# Patient Record
Sex: Female | Born: 1985
Health system: Southern US, Community
[De-identification: ages and names within clinical notes are randomized; demographics above are authoritative.]

## PROBLEM LIST (undated history)

## (undated) DIAGNOSIS — K219 Gastro-esophageal reflux disease without esophagitis: Secondary | ICD-10-CM

## (undated) DIAGNOSIS — F419 Anxiety disorder, unspecified: Secondary | ICD-10-CM

## (undated) DIAGNOSIS — F99 Mental disorder, not otherwise specified: Secondary | ICD-10-CM

## (undated) DIAGNOSIS — R519 Headache, unspecified: Secondary | ICD-10-CM

## (undated) HISTORY — DX: Headache, unspecified: R51.9

## (undated) HISTORY — DX: Anxiety disorder, unspecified: F41.9

## (undated) HISTORY — PX: TONSILLECTOMY: SUR1361

---
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
## 2009-06-16 ENCOUNTER — Emergency Department (HOSPITAL_COMMUNITY): Admission: EM | Admit: 2009-06-16 | Discharge: 2009-06-16 | Payer: Self-pay | Admitting: Emergency Medicine

## 2009-06-17 ENCOUNTER — Inpatient Hospital Stay (HOSPITAL_COMMUNITY): Admission: AD | Admit: 2009-06-17 | Discharge: 2009-06-19 | Payer: Self-pay | Admitting: Obstetrics & Gynecology

## 2009-06-17 ENCOUNTER — Ambulatory Visit: Payer: Self-pay | Admitting: Obstetrics & Gynecology

## 2011-02-25 LAB — WET PREP, GENITAL: Trich, Wet Prep: NONE SEEN

## 2011-02-25 LAB — DIFFERENTIAL
Eosinophils Absolute: 0.1 10*3/uL (ref 0.0–0.7)
Eosinophils Relative: 1 % (ref 0–5)
Lymphs Abs: 1.4 10*3/uL (ref 0.7–4.0)
Monocytes Absolute: 0.6 10*3/uL (ref 0.1–1.0)

## 2011-02-25 LAB — CBC
HCT: 31.8 % — ABNORMAL LOW (ref 36.0–46.0)
MCHC: 34 g/dL (ref 30.0–36.0)
MCV: 94.2 fL (ref 78.0–100.0)
MCV: 94.3 fL (ref 78.0–100.0)
Platelets: 225 10*3/uL (ref 150–400)
Platelets: 249 10*3/uL (ref 150–400)
RDW: 13.1 % (ref 11.5–15.5)
WBC: 13.5 10*3/uL — ABNORMAL HIGH (ref 4.0–10.5)
WBC: 15.7 10*3/uL — ABNORMAL HIGH (ref 4.0–10.5)

## 2011-02-25 LAB — URINE CULTURE

## 2011-02-25 LAB — URINALYSIS, ROUTINE W REFLEX MICROSCOPIC
Nitrite: NEGATIVE
Specific Gravity, Urine: 1.01 (ref 1.005–1.030)
Urobilinogen, UA: 0.2 mg/dL (ref 0.0–1.0)

## 2011-02-25 LAB — URINE MICROSCOPIC-ADD ON

## 2011-02-25 LAB — RAPID URINE DRUG SCREEN, HOSP PERFORMED
Barbiturates: NOT DETECTED
Benzodiazepines: NOT DETECTED
Cocaine: NOT DETECTED
Opiates: POSITIVE — AB

## 2011-02-25 LAB — ABO/RH: ABO/RH(D): O POS

## 2011-02-25 LAB — PREGNANCY, URINE: Preg Test, Ur: POSITIVE

## 2011-02-25 LAB — GC/CHLAMYDIA PROBE AMP, GENITAL: Chlamydia, DNA Probe: NEGATIVE

## 2011-02-25 LAB — HEPATITIS B SURFACE ANTIGEN: Hepatitis B Surface Ag: NEGATIVE

## 2012-07-30 ENCOUNTER — Other Ambulatory Visit (HOSPITAL_COMMUNITY)
Admission: RE | Admit: 2012-07-30 | Discharge: 2012-07-30 | Disposition: A | Discharge status: Home / Self Care | Payer: 59 | Source: Ambulatory Visit | Attending: Obstetrics and Gynecology | Admitting: Obstetrics and Gynecology

## 2012-07-30 DIAGNOSIS — Z01419 Encounter for gynecological examination (general) (routine) without abnormal findings: Secondary | ICD-10-CM | POA: Insufficient documentation

## 2013-08-12 ENCOUNTER — Encounter: Payer: Self-pay | Admitting: Obstetrics and Gynecology

## 2013-08-12 ENCOUNTER — Ambulatory Visit (INDEPENDENT_AMBULATORY_CARE_PROVIDER_SITE_OTHER): Payer: 59 | Admitting: Obstetrics and Gynecology

## 2013-08-12 VITALS — BP 120/80 | Ht 64.0 in | Wt 189.0 lb

## 2013-08-12 DIAGNOSIS — Z01419 Encounter for gynecological examination (general) (routine) without abnormal findings: Secondary | ICD-10-CM

## 2013-08-12 DIAGNOSIS — Z309 Encounter for contraceptive management, unspecified: Secondary | ICD-10-CM

## 2013-08-12 MED ORDER — ETONOGESTREL-ETHINYL ESTRADIOL 0.12-0.015 MG/24HR VA RING: VAGINAL_RING | VAGINAL | Status: DC

## 2013-08-12 NOTE — Progress Notes (Signed)
Patient ID: Cynthia Parrish, female   DOB: 10-Oct-1986, 27 y.o.   MRN: [id number] Pt here today for annual pap and physical  Assessment:  Annual Gyn Exam   Plan:  1. pap smear done last yr, next pap due 2016 2. return annually or prn 3    Annual mammogram advised Subjective:  Cynthia Parrish is a 27 y.o. female No obstetric history on file. who presents for annual exam. Patient's last menstrual period was 08/09/2013. The patient has complaints today of none  The following portions of the patient's history were reviewed and updated as appropriate: allergies, current medications, past family history, past medical history, past social history, past surgical history and problem list.  Review of Systems Constitutional: negative Gastrointestinal: negative Genitourinary:   Objective:  BP 120/80  Ht 5\' 4"  (1.626 m)  Wt 189 lb (85.73 kg)  BMI 32.43 kg/m2  LMP 08/09/2013   BMI: Body mass index is 32.43 kg/(m^2).  General Appearance: Alert, appropriate appearance for age. No acute distress HEENT: Grossly normal Neck / Thyroid:  Cardiovascular: RRR; normal S1, S2, no murmur Lungs: CTA bilaterally Back: No CVAT Breast Exam: No dimpling, nipple retraction or discharge. No masses or nodes. and No masses or nodes.No dimpling, nipple retraction or discharge. Gastrointestinal: Soft, non-tender, no masses or organomegaly Pelvic Exam: Vulva and vagina appear normal. Bimanual exam reveals normal uterus and adnexa. Rectovaginal: not indicated Lymphatic Exam: Non-palpable nodes in neck, clavicular, axillary, or inguinal regions Skin: no rash or abnormalities Neurologic: Normal gait and speech, no tremor  Psychiatric: Alert and oriented, appropriate affect.  Urinalysis:Not done  Christin Bach. MD Pgr [phone] 3:28 PM

## 2013-08-12 NOTE — Patient Instructions (Addendum)
Will need pap in 2016 rings can be renewed x 1 year.

## 2013-09-11 ENCOUNTER — Other Ambulatory Visit (HOSPITAL_COMMUNITY): Payer: 59

## 2013-09-18 ENCOUNTER — Other Ambulatory Visit (HOSPITAL_COMMUNITY): Payer: 59

## 2014-04-01 ENCOUNTER — Ambulatory Visit: Payer: 59 | Admitting: Orthopedic Surgery

## 2014-04-14 ENCOUNTER — Other Ambulatory Visit: Payer: Self-pay | Admitting: Orthopedic Surgery

## 2014-04-14 ENCOUNTER — Ambulatory Visit (HOSPITAL_COMMUNITY)
Admission: RE | Admit: 2014-04-14 | Discharge: 2014-04-14 | Disposition: A | Discharge status: Home / Self Care | Payer: 59 | Source: Ambulatory Visit | Attending: Orthopedic Surgery | Admitting: Orthopedic Surgery

## 2014-04-14 DIAGNOSIS — M25561 Pain in right knee: Secondary | ICD-10-CM

## 2014-04-14 DIAGNOSIS — M25569 Pain in unspecified knee: Secondary | ICD-10-CM | POA: Insufficient documentation

## 2014-04-14 DIAGNOSIS — M25562 Pain in left knee: Secondary | ICD-10-CM

## 2014-04-15 ENCOUNTER — Encounter: Payer: Self-pay | Admitting: Orthopedic Surgery

## 2014-04-15 ENCOUNTER — Ambulatory Visit (INDEPENDENT_AMBULATORY_CARE_PROVIDER_SITE_OTHER): Payer: 59 | Admitting: Orthopedic Surgery

## 2014-04-15 VITALS — BP 124/74 | Ht 64.0 in | Wt 195.0 lb

## 2014-04-15 DIAGNOSIS — M2241 Chondromalacia patellae, right knee: Secondary | ICD-10-CM

## 2014-04-15 DIAGNOSIS — M2242 Chondromalacia patellae, left knee: Principal | ICD-10-CM

## 2014-04-15 DIAGNOSIS — M2240 Chondromalacia patellae, unspecified knee: Secondary | ICD-10-CM

## 2014-04-15 MED ORDER — NABUMETONE 500 MG PO TABS: 500.0000 mg | ORAL_TABLET | Freq: Two times a day (BID) | ORAL | Status: DC

## 2014-04-15 NOTE — Patient Instructions (Signed)
Chondromalacia Your exam shows your knee pain is likely due to a cartilage swelling and irritation under the knee cap called chondromalacia. The knee cap moves up and down in its groove when you walk, run, or squat. It can become irritated from sports or work activities if the knee cap is not lined up perfectly or your quadriceps muscle is relatively weak. This can cause pain, usually around the knee cap but sometimes the back of the knee. It is most common in young and active people. Climbing stairs, prolonged sitting and rising from a chair will often make the pain worse. Treatment includes rest from activities which make it worse. The pain can be reduced with ice packs and anti-inflammatory pain medicine. Exercises to strengthen the thigh (quadriceps) muscle may help prevent further episodes of this condition. Shoe inserts to correct imbalances in the legs or feet may be prescribed by your doctor or a specialist. Support for the knee cap with a light brace may also be helpful. Call your caregiver if you are not improving after 2 - 3 weeks of treatment.  SEEK MEDICAL CARE IF:  You have increasing pain or your knee becomes hot, swollen, red, or begins to give out or lock up on you. Document Released: 12/13/2004 Document Revised: 01/28/2012 Document Reviewed: 05/03/2009 [facility] Patient Information 2014 [place].

## 2014-04-15 NOTE — Progress Notes (Signed)
Patient ID: Cynthia Parrish, female   DOB: 02/25/1986, 28 y.o.   MRN: [id number]  Chief Complaint  Patient presents with  . Knee Pain    Bilateral knee pain. No recent injury.    HISTORY: Today we have a 28 year old female with a history of bilateral knee pain right worse than left. She's had knee problems in the past at aspiration injection of cortisone x2 on the right knee now complains of recurrent pain for the last 6 months with associated symptoms of locking catching and anterior knee pain with stair climbing. Pain is worse at night and after activity such as standing pain is increased with climbing the steps. The pain is dull aching and burning 6/10 she's taken some Tylenol and has already had x-rays  No allergies  She is a smoker. Has a family history of asthma ulcers hypertension cancer and arthritis. Both parents are alive and both siblings are alive. Past medical history is negative she has had a tonsillectomy she is on no chronic medications  Review of systems is notable for sinusitis and sore throat swelling of the legs heartburn joint pain as described. Otherwise all systems were normal  Vital signs: BP 124/74  Ht 5\' 4"  (1.626 m)  Wt 195 lb (88.451 kg)  BMI 33.46 kg/m2  LMP 04/14/2014   General the patient is well-developed and well-nourished grooming and hygiene are normal Oriented x3 Mood and affect normal Ambulation normal ambulation without limp Inspection of the right knee patellofemoral pain with compression and traction quadriceps resistance test tenderness in the patellar facets Full range of motion All joints are stable Motor exam is normal Skin clean dry and intact  Mild crepitation in the left knee no pain or tenderness joint range of motion is normal and the joint is stable with normal muscular function and muscle tone  Cardiovascular exam is normal Sensory exam normal   Bilateral knee x-rays show a slightly dominant lateral facet bilaterally no  arthritis  Encounter Diagnosis  Name Primary?  . Chondromalacia of both patellae Yes    Orders Placed This Encounter  Procedures  . Ambulatory referral to Physical Therapy   Meds ordered this encounter  Medications  . nabumetone (RELAFEN) 500 MG tablet    Sig: Take 1 tablet (500 mg total) by mouth 2 (two) times daily.    Dispense:  60 tablet    Refill:  5

## 2014-07-07 ENCOUNTER — Other Ambulatory Visit (HOSPITAL_COMMUNITY): Payer: Self-pay | Admitting: Family Medicine

## 2014-07-07 DIAGNOSIS — R1013 Epigastric pain: Secondary | ICD-10-CM

## 2014-07-09 ENCOUNTER — Ambulatory Visit (HOSPITAL_COMMUNITY): Payer: 59

## 2014-07-12 ENCOUNTER — Ambulatory Visit (HOSPITAL_COMMUNITY)
Admission: RE | Admit: 2014-07-12 | Discharge: 2014-07-12 | Disposition: A | Discharge status: Home / Self Care | Payer: 59 | Source: Ambulatory Visit | Attending: Family Medicine | Admitting: Family Medicine

## 2014-07-12 DIAGNOSIS — R1013 Epigastric pain: Secondary | ICD-10-CM | POA: Insufficient documentation

## 2014-07-19 ENCOUNTER — Other Ambulatory Visit (HOSPITAL_COMMUNITY): Payer: Self-pay | Admitting: Family Medicine

## 2014-07-19 DIAGNOSIS — R1011 Right upper quadrant pain: Secondary | ICD-10-CM

## 2014-07-21 ENCOUNTER — Encounter (HOSPITAL_COMMUNITY): Payer: 59

## 2014-08-09 ENCOUNTER — Other Ambulatory Visit: Payer: Self-pay | Admitting: *Deleted

## 2014-08-13 ENCOUNTER — Telehealth: Payer: Self-pay | Admitting: Obstetrics and Gynecology

## 2014-08-13 MED ORDER — ETONOGESTREL-ETHINYL ESTRADIOL 0.12-0.015 MG/24HR VA RING: VAGINAL_RING | VAGINAL | Status: DC

## 2014-08-13 NOTE — Telephone Encounter (Signed)
Pt aware medication was sent to the pharmacy.

## 2014-08-13 NOTE — Telephone Encounter (Signed)
refil x 1 yr Nuvaring

## 2015-02-07 ENCOUNTER — Emergency Department (HOSPITAL_COMMUNITY)
Admission: EM | Admit: 2015-02-07 | Discharge: 2015-02-07 | Disposition: A | Discharge status: Home / Self Care | Payer: 59 | Attending: Emergency Medicine | Admitting: Emergency Medicine

## 2015-02-07 ENCOUNTER — Encounter (HOSPITAL_COMMUNITY): Payer: Self-pay

## 2015-02-07 DIAGNOSIS — Y9389 Activity, other specified: Secondary | ICD-10-CM | POA: Insufficient documentation

## 2015-02-07 DIAGNOSIS — S0083XA Contusion of other part of head, initial encounter: Secondary | ICD-10-CM | POA: Diagnosis not present

## 2015-02-07 DIAGNOSIS — Z720 Tobacco use: Secondary | ICD-10-CM | POA: Insufficient documentation

## 2015-02-07 DIAGNOSIS — S0990XA Unspecified injury of head, initial encounter: Secondary | ICD-10-CM | POA: Diagnosis present

## 2015-02-07 DIAGNOSIS — Y998 Other external cause status: Secondary | ICD-10-CM | POA: Diagnosis not present

## 2015-02-07 DIAGNOSIS — S161XXA Strain of muscle, fascia and tendon at neck level, initial encounter: Secondary | ICD-10-CM | POA: Diagnosis not present

## 2015-02-07 DIAGNOSIS — Z791 Long term (current) use of non-steroidal anti-inflammatories (NSAID): Secondary | ICD-10-CM | POA: Diagnosis not present

## 2015-02-07 DIAGNOSIS — Y92410 Unspecified street and highway as the place of occurrence of the external cause: Secondary | ICD-10-CM | POA: Insufficient documentation

## 2015-02-07 MED ORDER — KETOROLAC TROMETHAMINE 30 MG/ML IJ SOLN
15.0000 mg | Freq: Once | INTRAMUSCULAR | Status: AC
  Administered 2015-02-07: 15 mg via INTRAVENOUS
  Filled 2015-02-07: qty 1

## 2015-02-07 MED ORDER — MORPHINE SULFATE 4 MG/ML IJ SOLN
6.0000 mg | Freq: Once | INTRAMUSCULAR | Status: DC
  Filled 2015-02-07: qty 2
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
## 2015-02-07 NOTE — ED Notes (Signed)
Pt refused morphine.

## 2015-02-07 NOTE — Discharge Instructions (Signed)
Cervical Sprain °A cervical sprain is an injury in the neck in which the strong, fibrous tissues (ligaments) that connect your neck bones stretch or tear. Cervical sprains can range from mild to severe. Severe cervical sprains can cause the neck vertebrae to be unstable. This can lead to damage of the spinal cord and can result in serious nervous system problems. The amount of time it takes for a cervical sprain to get better depends on the cause and extent of the injury. Most cervical sprains heal in 1 to 3 weeks. °CAUSES  °Severe cervical sprains may be caused by:  °· Contact sport injuries (such as from football, rugby, wrestling, hockey, auto racing, gymnastics, diving, martial arts, or boxing).   °· Motor vehicle collisions.   °· Whiplash injuries. This is an injury from a sudden forward and backward whipping movement of the head and neck.  °· Falls.   °Mild cervical sprains may be caused by:  °· Being in an awkward position, such as while cradling a telephone between your ear and shoulder.   °· Sitting in a chair that does not offer proper support.   °· Working at a poorly designed computer station.   °· Looking up or down for long periods of time.   °SYMPTOMS  °· Pain, soreness, stiffness, or a burning sensation in the front, back, or sides of the neck. This discomfort may develop immediately after the injury or slowly, 24 hours or more after the injury.   °· Pain or tenderness directly in the middle of the back of the neck.   °· Shoulder or upper back pain.   °· Limited ability to move the neck.   °· Headache.   °· Dizziness.   °· Weakness, numbness, or tingling in the hands or arms.   °· Muscle spasms.   °· Difficulty swallowing or chewing.   °· Tenderness and swelling of the neck.   °DIAGNOSIS  °Most of the time your health care provider can diagnose a cervical sprain by taking your history and doing a physical exam. Your health care provider will ask about previous neck injuries and any known neck  problems, such as arthritis in the neck. X-rays may be taken to find out if there are any other problems, such as with the bones of the neck. Other tests, such as a CT scan or MRI, may also be needed.  °TREATMENT  °Treatment depends on the severity of the cervical sprain. Mild sprains can be treated with rest, keeping the neck in place (immobilization), and pain medicines. Severe cervical sprains are immediately immobilized. Further treatment is done to help with pain, muscle spasms, and other symptoms and may include: °· Medicines, such as pain relievers, numbing medicines, or muscle relaxants.   °· Physical therapy. This may involve stretching exercises, strengthening exercises, and posture training. Exercises and improved posture can help stabilize the neck, strengthen muscles, and help stop symptoms from returning.   °HOME CARE INSTRUCTIONS  °· Put ice on the injured area.   °¨ Put ice in a plastic bag.   °¨ Place a towel between your skin and the bag.   °¨ Leave the ice on for 15-20 minutes, 3-4 times a day.   °· If your injury was severe, you may have been given a cervical collar to wear. A cervical collar is a two-piece collar designed to keep your neck from moving while it heals. °¨ Do not remove the collar unless instructed by your health care provider. °¨ If you have long hair, keep it outside of the collar. °¨ Ask your health care provider before making any adjustments to your collar. Minor   adjustments may be required over time to improve comfort and reduce pressure on your chin or on the back of your head.  Ifyou are allowed to remove the collar for cleaning or bathing, follow your health care provider's instructions on how to do so safely.  Keep your collar clean by wiping it with mild soap and water and drying it completely. If the collar you have been given includes removable pads, remove them every 1-2 days and hand wash them with soap and water. Allow them to air dry. They should be completely  dry before you wear them in the collar.  If you are allowed to remove the collar for cleaning and bathing, wash and dry the skin of your neck. Check your skin for irritation or sores. If you see any, tell your health care provider.  Do not drive while wearing the collar.   Only take over-the-counter or prescription medicines for pain, discomfort, or fever as directed by your health care provider.   Keep all follow-up appointments as directed by your health care provider.   Keep all physical therapy appointments as directed by your health care provider.   Make any needed adjustments to your workstation to promote good posture.   Avoid positions and activities that make your symptoms worse.   Warm up and stretch before being active to help prevent problems.  SEEK MEDICAL CARE IF:   Your pain is not controlled with medicine.   You are unable to decrease your pain medicine over time as planned.   Your activity level is not improving as expected.  SEEK IMMEDIATE MEDICAL CARE IF:   You develop any bleeding.  You develop stomach upset.  You have signs of an allergic reaction to your medicine.   Your symptoms get worse.   You develop new, unexplained symptoms.   You have numbness, tingling, weakness, or paralysis in any part of your body.  MAKE SURE YOU:   Understand these instructions.  Will watch your condition.  Will get help right away if you are not doing well or get worse. Document Released: 09/02/2007 Document Revised: 11/10/2013 Document Reviewed: 05/13/2013 [facility] Patient Information 2015 [place], [place]. This information is not intended to replace advice given to you by your health care provider. Make sure you discuss any questions you have with your health care provider.  Facial or Scalp Contusion  A facial or scalp contusion is a deep bruise on the face or head. Contusions happen when an injury causes bleeding under the skin. Signs of bruising  include pain, puffiness (swelling), and discolored skin. The contusion may turn blue, purple, or yellow. HOME CARE  Only take medicines as told by your doctor.  Put ice on the injured area.  Put ice in a plastic bag.  Place a towel between your skin and the bag.  Leave the ice on for 20 minutes, 2-3 times a day. GET HELP IF:  You have bite problems.  You have pain when chewing.  You are worried about your face not healing normally. GET HELP RIGHT AWAY IF:   You have severe pain or a headache and medicine does not help.  You are very tired or confused, or your personality changes.  You throw up (vomit).  You have a nosebleed that will not stop.  You see two of everything (double vision) or have blurry vision.  You have fluid coming from your nose or ear.  You have problems walking or using your arms or legs. MAKE SURE YOU:  Understand these instructions.  Will watch your condition.  Will get help right away if you are not doing well or get worse. Document Released: 10/25/2011 Document Revised: 08/26/2013 Document Reviewed: 06/18/2013 [facility] Patient Information 2015 [place], [place]. This information is not intended to replace advice given to you by your health care provider. Make sure you discuss any questions you have with your health care provider.  Motor Vehicle Collision It is common to have multiple bruises and sore muscles after a motor vehicle collision (MVC). These tend to feel worse for the first 24 hours. You may have the most stiffness and soreness over the first several hours. You may also feel worse when you wake up the first morning after your collision. After this point, you will usually begin to improve with each day. The speed of improvement often depends on the severity of the collision, the number of injuries, and the location and nature of these injuries. HOME CARE INSTRUCTIONS  Put ice on the injured area.  Put ice in a plastic bag.  Place a  towel between your skin and the bag.  Leave the ice on for 15-20 minutes, 3-4 times a day, or as directed by your health care provider.  Drink enough fluids to keep your urine clear or pale yellow. Do not drink alcohol.  Take a warm shower or bath once or twice a day. This will increase blood flow to sore muscles.  You may return to activities as directed by your caregiver. Be careful when lifting, as this may aggravate neck or back pain.  Only take over-the-counter or prescription medicines for pain, discomfort, or fever as directed by your caregiver. Do not use aspirin. This may increase bruising and bleeding. SEEK IMMEDIATE MEDICAL CARE IF:  You have numbness, tingling, or weakness in the arms or legs.  You develop severe headaches not relieved with medicine.  You have severe neck pain, especially tenderness in the middle of the back of your neck.  You have changes in bowel or bladder control.  There is increasing pain in any area of the body.  You have shortness of breath, light-headedness, dizziness, or fainting.  You have chest pain.  You feel sick to your stomach (nauseous), throw up (vomit), or sweat.  You have increasing abdominal discomfort.  There is blood in your urine, stool, or vomit.  You have pain in your shoulder (shoulder strap areas).  You feel your symptoms are getting worse. MAKE SURE YOU:  Understand these instructions.  Will watch your condition.  Will get help right away if you are not doing well or get worse. Document Released: 11/05/2005 Document Revised: 03/22/2014 Document Reviewed: 04/04/2011 [facility] Patient Information 2015 [place], [place]. This information is not intended to replace advice given to you by your health care provider. Make sure you discuss any questions you have with your health care provider.

## 2015-03-29 ENCOUNTER — Ambulatory Visit (HOSPITAL_COMMUNITY)
Admission: RE | Admit: 2015-03-29 | Discharge: 2015-03-29 | Disposition: A | Discharge status: Home / Self Care | Payer: 59 | Source: Ambulatory Visit | Attending: Orthopedic Surgery | Admitting: Orthopedic Surgery

## 2015-03-29 ENCOUNTER — Ambulatory Visit (INDEPENDENT_AMBULATORY_CARE_PROVIDER_SITE_OTHER): Payer: 59 | Admitting: Orthopedic Surgery

## 2015-03-29 ENCOUNTER — Encounter: Payer: Self-pay | Admitting: Orthopedic Surgery

## 2015-03-29 ENCOUNTER — Other Ambulatory Visit: Payer: Self-pay | Admitting: Orthopedic Surgery

## 2015-03-29 VITALS — BP 113/81 | Ht 64.0 in | Wt 205.0 lb

## 2015-03-29 DIAGNOSIS — R2232 Localized swelling, mass and lump, left upper limb: Secondary | ICD-10-CM | POA: Insufficient documentation

## 2015-03-29 NOTE — Progress Notes (Signed)
Patient ID: Cynthia Parrish, female   DOB: [birth year], 29 y.o.   MRN: [id number] Chief Complaint  Patient presents with  . Hand Problem    knot on left hand, no known injury   PMD DR HOWARD Left index finger painful mass  Right hand dominant Mass present felt 2-3 months No injury Pain and aching especially after activity Pain rated 4 out of 10 Treated with ibuprofen No improvement  Review of systems negative musculoskeletal joint pain as stated medical problems none Surgery tonsillectomy 2006 medications as listed Allergies none Family history diabetes reflex hypertension cancer arthritis 1 pack per day smoker CNA at [employer]  no drinking no drug use  BP 113/81 mmHg  Ht 5\' 4"  (1.626 m)  Wt 205 lb (92.987 kg)  BMI 35.17 kg/m2  LMP 03/29/2015 Gen. appearance normal development grooming and hygiene Orientation person place and time normal Mood affect normal Gait and station noncontributory normal Right and left hand comparison proximal dorsal radial left index finger 5-7 mm mass felt below the skin above the bone and not involving the joint or tendon full range of motion in the hand. Stability of the joint normal. Extension flexion strength normal. Skin no rash or lesion. Color of the finger normal radial pulse normal. Epitrochlear lymph nodes negative. Sensation normal.  X-ray negative and read by me independently  Impression mass left index finger Encounter Diagnosis  Name Primary?  . Mass of finger of left hand Yes   She would like this excised because of pain  26115 cpt  Plan excision mass left index finger

## 2015-03-30 ENCOUNTER — Telehealth: Payer: Self-pay | Admitting: Orthopedic Surgery

## 2015-03-30 NOTE — Telephone Encounter (Signed)
Per Esau Grew, [employer] representative, no pre-certification required for out-patient surgery as noted, per call back 03/30/15, 1:58 p.m.

## 2015-03-30 NOTE — Telephone Encounter (Addendum)
Regarding out-patient surgery scheduled at [facility] 04/01/15, planned CPT 26115, ICD-10 R22.32, contacted insurer,[employer], requested pre-authorization through [facility] online portal; awaiting notification.  04/01/15 Update: CPT 26210 per Dr Aline Brochure.  (still no pre-authorization required for out-patient procedures)

## 2015-03-30 NOTE — Patient Instructions (Signed)
Cynthia Parrish  03/30/2015   Your procedure is scheduled on:  04/01/2015  Report to [facility] at  900  AM.  Call this number if you have problems the morning of surgery: [phone]   Remember:   Do not eat food or drink liquids after midnight.   Take these medicines the morning of surgery with A SIP OF WATER:  none   Do not wear jewelry, make-up or nail polish.  Do not wear lotions, powders, or perfumes.  Do not shave 48 hours prior to surgery. Men may shave face and neck.  Do not bring valuables to the hospital.  [facility] is not responsible for any belongings or valuables.               Contacts, dentures or bridgework may not be worn into surgery.  Leave suitcase in the car. After surgery it may be brought to your room.  For patients admitted to the hospital, discharge time is determined by your treatment team.               Patients discharged the day of surgery will not be allowed to drive home.  Name and phone number of your driver: family  Special Instructions: Shower using CHG 2 nights before surgery and the night before surgery.  If you shower the day of surgery use CHG.  Use special wash - you have one bottle of CHG for all showers.  You should use approximately 1/3 of the bottle for each shower.   Please read over the following fact sheets that you were given: Pain Booklet, Coughing and Deep Breathing, Surgical Site Infection Prevention, Anesthesia Post-op Instructions and Care and Recovery After Surgery Cyst Removal Your caregiver has removed a cyst. A cyst is a sac containing a semi-solid material. Cysts may occur any place on your body. They may remain small for years or gradually get larger. A sebaceous cyst is an enlarged (dilated) sweat gland filled with old sweat (sebum). Unattended, these may become large (the size of a softball) over several years time. These are often removed for improved appearance (cosmetic) reasons or before they become infected to form  an abscess. An abscess is an infected cyst. HOME CARE INSTRUCTIONS   Keep your bandage clean and dry. You may change your bandage after 24 hours. If your bandage sticks, use warm water to gently loosen it. Pat the area dry with a clean towel before putting on another bandage.  If possible, keep the area where the cyst was removed raised to relieve soreness, swelling, and promote healing.  If you have stitches, keep them clean and dry.  You may clean your stitches gently with a cotton swab dipped in warm soapy water.  Do not soak the area where the cyst was removed or go swimming. You may shower.  Do not overuse the area where your cyst was removed.  Return in 7 days or as directed to have your stitches removed.  Take medicines as instructed by your caregiver. SEEK IMMEDIATE MEDICAL CARE IF:   An oral temperature above 102 F (38.9 C) develops, not controlled by medication.  Blood continues to soak through the bandage.  You have increasing pain in the area where your cyst was removed.  You have redness, swelling, pus, a bad smell, soreness (inflammation), or red streaks coming away from the stitches. These are signs of infection. MAKE SURE YOU:   Understand these instructions.  Will watch your condition.  Will get help right away if you are not doing well or get worse. Document Released: 11/02/2000 Document Revised: 01/28/2012 Document Reviewed: 02/26/2008 [facility] Patient Information 2015 [place], [place]. This information is not intended to replace advice given to you by your health care provider. Make sure you discuss any questions you have with your health care provider. PATIENT INSTRUCTIONS POST-ANESTHESIA  IMMEDIATELY FOLLOWING SURGERY:  Do not drive or operate machinery for the first twenty four hours after surgery.  Do not make any important decisions for twenty four hours after surgery or while taking narcotic pain medications or sedatives.  If you develop intractable  nausea and vomiting or a severe headache please notify your doctor immediately.  FOLLOW-UP:  Please make an appointment with your surgeon as instructed. You do not need to follow up with anesthesia unless specifically instructed to do so.  WOUND CARE INSTRUCTIONS (if applicable):  Keep a dry clean dressing on the anesthesia/puncture wound site if there is drainage.  Once the wound has quit draining you may leave it open to air.  Generally you should leave the bandage intact for twenty four hours unless there is drainage.  If the epidural site drains for more than 36-48 hours please call the anesthesia department.  QUESTIONS?:  Please feel free to call your physician or the hospital operator if you have any questions, and they will be happy to assist you.

## 2015-03-31 ENCOUNTER — Encounter (HOSPITAL_COMMUNITY): Payer: Self-pay

## 2015-03-31 ENCOUNTER — Encounter (HOSPITAL_COMMUNITY)
Admission: RE | Admit: 2015-03-31 | Discharge: 2015-03-31 | Disposition: A | Discharge status: Home / Self Care | Payer: 59 | Source: Ambulatory Visit | Attending: Orthopedic Surgery | Admitting: Orthopedic Surgery

## 2015-03-31 DIAGNOSIS — M778 Other enthesopathies, not elsewhere classified: Secondary | ICD-10-CM | POA: Diagnosis not present

## 2015-03-31 LAB — BASIC METABOLIC PANEL
Anion gap: 7 (ref 5–15)
BUN: 7 mg/dL (ref 6–20)
CHLORIDE: 106 mmol/L (ref 101–111)
CO2: 25 mmol/L (ref 22–32)
Calcium: 9 mg/dL (ref 8.9–10.3)
Creatinine, Ser: 0.92 mg/dL (ref 0.44–1.00)
GFR calc Af Amer: >60 mL/min (ref >60)
GFR calc non Af Amer: >60 mL/min (ref >60)
Glucose, Bld: 101 mg/dL — ABNORMAL HIGH (ref 65–99)
Potassium: 3.9 mmol/L (ref 3.5–5.1)
SODIUM: 138 mmol/L (ref 135–145)

## 2015-03-31 LAB — CBC WITH DIFFERENTIAL/PLATELET
BASOS PCT: 0 % (ref 0–1)
Basophils Absolute: 0 10*3/uL (ref 0.0–0.1)
EOS ABS: 0.2 10*3/uL (ref 0.0–0.7)
Eosinophils Relative: 3 % (ref 0–5)
HEMATOCRIT: 39.7 % (ref 36.0–46.0)
Hemoglobin: 13.2 g/dL (ref 12.0–15.0)
LYMPHS PCT: 32 % (ref 12–46)
Lymphs Abs: 2.6 10*3/uL (ref 0.7–4.0)
MCH: 29.7 pg (ref 26.0–34.0)
MCHC: 33.2 g/dL (ref 30.0–36.0)
MCV: 89.2 fL (ref 78.0–100.0)
Monocytes Absolute: 0.5 10*3/uL (ref 0.1–1.0)
Monocytes Relative: 6 % (ref 3–12)
NEUTROS ABS: 4.7 10*3/uL (ref 1.7–7.7)
Neutrophils Relative %: 59 % (ref 43–77)
Platelets: 247 10*3/uL (ref 150–400)
RBC: 4.45 MIL/uL (ref 3.87–5.11)
RDW: 12.5 % (ref 11.5–15.5)
WBC: 8 10*3/uL (ref 4.0–10.5)

## 2015-03-31 LAB — HCG, SERUM, QUALITATIVE: Preg, Serum: NEGATIVE

## 2015-03-31 NOTE — H&P (Signed)
Patient ID: Cynthia Parrish, female   DOB: 1986/03/04, 29 y.o.   MRN: [id number] Chief Complaint   Patient presents with   .  Hand Problem       knot on left hand, no known injury    PMD DR HOWARD Left index finger painful mass   Right hand dominant Mass present felt 2-3 months No injury Pain and aching especially after activity Pain rated 4 out of 10 Treated with ibuprofen No improvement  Review of systems negative musculoskeletal joint pain as stated medical problems none Surgery tonsillectomy 2006 medications as listed Allergies none Family history diabetes reflex hypertension cancer arthritis 1 pack per day smoker CNA at [employer]   no drinking no drug use  BP 113/81 mmHg  Ht 5\' 4"  (1.626 m)  Wt 205 lb (92.987 kg)  BMI 35.17 kg/m2  LMP 03/29/2015 Gen. appearance normal development grooming and hygiene Orientation person place and time normal Mood affect normal Gait and station noncontributory normal Right and left hand comparison proximal dorsal radial left index finger 5-7 mm mass felt below the skin above the bone and not involving the joint or tendon full range of motion in the hand. Stability of the joint normal. Extension flexion strength normal. Skin no rash or lesion. Color of the finger normal radial pulse normal. Epitrochlear lymph nodes negative. Sensation normal.  X-ray negative and read by me independently  Impression mass left index finger Encounter Diagnosis   Name  Primary?   .  Mass of finger of left hand  Yes    She would like this excised because of pain  26115 cpt  Plan excision mass left index finger

## 2015-04-01 ENCOUNTER — Ambulatory Visit (HOSPITAL_COMMUNITY): Payer: 59 | Admitting: Anesthesiology

## 2015-04-01 ENCOUNTER — Encounter (HOSPITAL_COMMUNITY): Payer: Self-pay | Admitting: Anesthesiology

## 2015-04-01 ENCOUNTER — Ambulatory Visit (HOSPITAL_COMMUNITY)
Admission: RE | Admit: 2015-04-01 | Discharge: 2015-04-01 | Disposition: A | Discharge status: Home / Self Care | Payer: 59 | Source: Ambulatory Visit | Attending: Orthopedic Surgery | Admitting: Orthopedic Surgery

## 2015-04-01 ENCOUNTER — Encounter (HOSPITAL_COMMUNITY)
Admission: RE | Disposition: A | Discharge status: Home / Self Care | Payer: Self-pay | Source: Ambulatory Visit | Attending: Orthopedic Surgery

## 2015-04-01 DIAGNOSIS — M8560 Other cyst of bone, unspecified site: Secondary | ICD-10-CM

## 2015-04-01 DIAGNOSIS — M778 Other enthesopathies, not elsewhere classified: Secondary | ICD-10-CM | POA: Insufficient documentation

## 2015-04-01 HISTORY — PX: MASS EXCISION: SHX2000

## 2015-04-01 SURGERY — EXCISION MASS
Anesthesia: Monitor Anesthesia Care | Site: Finger | Laterality: Left

## 2015-04-01 MED ORDER — MIDAZOLAM HCL 2 MG/2ML IJ SOLN
INTRAMUSCULAR | Status: AC
  Filled 2015-04-01: qty 2

## 2015-04-01 MED ORDER — BUPIVACAINE HCL (PF) 0.5 % IJ SOLN
INTRAMUSCULAR | Status: AC
  Filled 2015-04-01: qty 30

## 2015-04-01 MED ORDER — LIDOCAINE HCL (PF) 0.5 % IJ SOLN
INTRAMUSCULAR | Status: AC
  Filled 2015-04-01: qty 50

## 2015-04-01 MED ORDER — CHLORHEXIDINE GLUCONATE 4 % EX LIQD: 60.0000 mL | Freq: Once | CUTANEOUS | Status: DC

## 2015-04-01 MED ORDER — FENTANYL CITRATE (PF) 100 MCG/2ML IJ SOLN
INTRAMUSCULAR | Status: AC
  Filled 2015-04-01: qty 2

## 2015-04-01 MED ORDER — FENTANYL CITRATE (PF) 100 MCG/2ML IJ SOLN
INTRAMUSCULAR | Status: DC | PRN
  Administered 2015-04-01 (×2): 50 ug via INTRAVENOUS

## 2015-04-01 MED ORDER — FENTANYL CITRATE (PF) 100 MCG/2ML IJ SOLN
25.0000 ug | INTRAMUSCULAR | Status: AC
  Administered 2015-04-01 (×2): 25 ug via INTRAVENOUS

## 2015-04-01 MED ORDER — BUPIVACAINE HCL (PF) 0.5 % IJ SOLN
INTRAMUSCULAR | Status: DC | PRN
  Administered 2015-04-01: .5 mL

## 2015-04-01 MED ORDER — FENTANYL CITRATE (PF) 100 MCG/2ML IJ SOLN: 25.0000 ug | INTRAMUSCULAR | Status: DC | PRN

## 2015-04-01 MED ORDER — ONDANSETRON HCL 4 MG/2ML IJ SOLN: 4.0000 mg | Freq: Once | INTRAMUSCULAR | Status: AC | PRN

## 2015-04-01 MED ORDER — HYDROCODONE-ACETAMINOPHEN 5-325 MG PO TABS: 1.0000 | ORAL_TABLET | Freq: Four times a day (QID) | ORAL | Status: DC | PRN

## 2015-04-01 MED ORDER — 0.9 % SODIUM CHLORIDE (POUR BTL) OPTIME
TOPICAL | Status: DC | PRN
  Administered 2015-04-01: 1000 mL

## 2015-04-01 MED ORDER — MIDAZOLAM HCL 2 MG/2ML IJ SOLN
1.0000 mg | INTRAMUSCULAR | Status: DC | PRN
  Administered 2015-04-01: 2 mg via INTRAVENOUS

## 2015-04-01 MED ORDER — CEFAZOLIN SODIUM-DEXTROSE 2-3 GM-% IV SOLR
2.0000 g | INTRAVENOUS | Status: AC
  Administered 2015-04-01: 2 g via INTRAVENOUS
  Filled 2015-04-01: qty 50

## 2015-04-01 MED ORDER — LIDOCAINE HCL (CARDIAC) 10 MG/ML IV SOLN
INTRAVENOUS | Status: DC | PRN
  Administered 2015-04-01: 50 mg via INTRAVENOUS

## 2015-04-01 MED ORDER — PROPOFOL INFUSION 10 MG/ML OPTIME
INTRAVENOUS | Status: DC | PRN
  Administered 2015-04-01: 75 ug/kg/min via INTRAVENOUS

## 2015-04-01 MED ORDER — MIDAZOLAM HCL 5 MG/5ML IJ SOLN
INTRAMUSCULAR | Status: DC | PRN
  Administered 2015-04-01 (×2): 1 mg via INTRAVENOUS

## 2015-04-01 MED ORDER — LACTATED RINGERS IV SOLN
INTRAVENOUS | Status: DC
  Administered 2015-04-01: 1000 mL via INTRAVENOUS

## 2015-04-01 SURGICAL SUPPLY — 49 items
BAG HAMPER (MISCELLANEOUS) ×2 IMPLANT
BANDAGE ELASTIC 2 VELCRO NS LF (GAUZE/BANDAGES/DRESSINGS) ×2 IMPLANT
BANDAGE ELASTIC 3 VELCRO NS (GAUZE/BANDAGES/DRESSINGS) IMPLANT
BANDAGE ELASTIC 3 VELCRO ST LF (GAUZE/BANDAGES/DRESSINGS) ×2 IMPLANT
BANDAGE ELASTIC 6 VELCRO ST LF (GAUZE/BANDAGES/DRESSINGS) IMPLANT
BANDAGE ESMARK 4X12 BL STRL LF (DISPOSABLE) ×1 IMPLANT
BLADE SURG 15 STRL LF DISP TIS (BLADE) ×1 IMPLANT
BLADE SURG 15 STRL SS (BLADE) ×1
BLADE SURG SZ10 CARB STEEL (BLADE) ×2 IMPLANT
BNDG COHESIVE 4X5 TAN STRL (GAUZE/BANDAGES/DRESSINGS) ×2 IMPLANT
BNDG CONFORM 2 STRL LF (GAUZE/BANDAGES/DRESSINGS) ×2 IMPLANT
BNDG ESMARK 4X12 BLUE STRL LF (DISPOSABLE) ×2
BNDG GAUZE ELAST 4 BULKY (GAUZE/BANDAGES/DRESSINGS) ×2 IMPLANT
CHLORAPREP W/TINT 26ML (MISCELLANEOUS) ×2 IMPLANT
CLOTH BEACON ORANGE TIMEOUT ST (SAFETY) ×2 IMPLANT
COVER LIGHT HANDLE STERIS (MISCELLANEOUS) ×4 IMPLANT
CUFF TOURNIQUET SINGLE 18IN (TOURNIQUET CUFF) ×2 IMPLANT
DECANTER SPIKE VIAL GLASS SM (MISCELLANEOUS) ×2 IMPLANT
DRSG XEROFORM 1X8 (GAUZE/BANDAGES/DRESSINGS) ×2 IMPLANT
ELECT NEEDLE TIP 2.8 STRL (NEEDLE) ×2 IMPLANT
ELECT REM PT RETURN 9FT ADLT (ELECTROSURGICAL) ×2
ELECTRODE REM PT RTRN 9FT ADLT (ELECTROSURGICAL) ×1 IMPLANT
FORMALIN 10 PREFIL 120ML (MISCELLANEOUS) ×2 IMPLANT
GAUZE SPONGE 4X4 12PLY STRL (GAUZE/BANDAGES/DRESSINGS) ×4 IMPLANT
GLOVE BIO SURGEON STRL SZ7 (GLOVE) ×4 IMPLANT
GLOVE BIOGEL PI IND STRL 7.0 (GLOVE) ×2 IMPLANT
GLOVE BIOGEL PI INDICATOR 7.0 (GLOVE) ×2
GLOVE SKINSENSE NS SZ8.0 LF (GLOVE) ×1
GLOVE SKINSENSE STRL SZ8.0 LF (GLOVE) ×1 IMPLANT
GLOVE SS N UNI LF 8.5 STRL (GLOVE) ×2 IMPLANT
GOWN STRL REUS W/TWL LRG LVL3 (GOWN DISPOSABLE) ×2 IMPLANT
GOWN STRL REUS W/TWL XL LVL3 (GOWN DISPOSABLE) ×2 IMPLANT
HAND ALUMI LG (SOFTGOODS) ×2 IMPLANT
HAND ALUMI XLG (SOFTGOODS) IMPLANT
INST SET MINOR BONE (KITS) IMPLANT
KIT ROOM TURNOVER APOR (KITS) ×2 IMPLANT
MANIFOLD NEPTUNE II (INSTRUMENTS) ×2 IMPLANT
NEEDLE HYPO 21X1.5 SAFETY (NEEDLE) ×2 IMPLANT
NS IRRIG 1000ML POUR BTL (IV SOLUTION) ×2 IMPLANT
PACK BASIC LIMB (CUSTOM PROCEDURE TRAY) ×2 IMPLANT
PAD ARMBOARD 7.5X6 YLW CONV (MISCELLANEOUS) ×2 IMPLANT
SET BASIN LINEN APH (SET/KITS/TRAYS/PACK) ×2 IMPLANT
STRIP CLOSURE SKIN 1/2X4 (GAUZE/BANDAGES/DRESSINGS) ×2 IMPLANT
SUT BONE WAX W31G (SUTURE) ×2 IMPLANT
SUT ETHILON 3 0 FSL (SUTURE) ×2 IMPLANT
SUT MON AB 2-0 SH 27 (SUTURE) ×1
SUT MON AB 2-0 SH27 (SUTURE) ×1 IMPLANT
SUT PROLENE 3 0 PS 2 (SUTURE) ×2 IMPLANT
SYR CONTROL 10ML LL (SYRINGE) ×2 IMPLANT

## 2015-04-01 NOTE — Anesthesia Preprocedure Evaluation (Signed)
Anesthesia Evaluation  Patient identified by MRN, date of birth, ID band Patient awake    Reviewed: Allergy & Precautions, NPO status , Patient's Chart, lab work & pertinent test results  Airway Mallampati: III  TM Distance: <3 FB   Mouth opening: Limited Mouth Opening  Dental  (+) Teeth Intact   Pulmonary Current Smoker,  breath sounds clear to auscultation        Cardiovascular negative cardio ROS  Rhythm:Regular Rate:Normal     Neuro/Psych    GI/Hepatic GERD-  Medicated and Poorly Controlled,  Endo/Other    Renal/GU      Musculoskeletal   Abdominal   Peds  Hematology   Anesthesia Other Findings   Reproductive/Obstetrics                             Anesthesia Physical Anesthesia Plan  ASA: II  Anesthesia Plan: MAC and Bier Block   Post-op Pain Management:    Induction: Intravenous  Airway Management Planned: Nasal Cannula  Additional Equipment:   Intra-op Plan:   Post-operative Plan:   Informed Consent: I have reviewed the patients History and Physical, chart, labs and discussed the procedure including the risks, benefits and alternatives for the proposed anesthesia with the patient or authorized representative who has indicated his/her understanding and acceptance.     Plan Discussed with:   Anesthesia Plan Comments:         Anesthesia Quick Evaluation

## 2015-04-01 NOTE — Op Note (Signed)
04/01/2015  11:44 AM  PATIENT:  Cynthia Parrish  29 y.o. female  PRE-OPERATIVE DIAGNOSIS:  MASS LEFT INDEX FINGER  POST-OPERATIVE DIAGNOSIS:  BONE SPUR LEFT INDEX FINGER  Findings: prominent bone at base of proximal left index finger   PROCEDURE:  Procedure(s): EXCISION BONE SPUR  LEFT INDEX FINGER (Left)  SURGEON:  Surgeon(s) and Role:    * Carole Civil, MD - Primary  PHYSICIAN ASSISTANT:   ASSISTANTS: none   ANESTHESIA:   regional and spinal  EBL:  Total I/O In: 400 [I.V.:400] Out: -   BLOOD ADMINISTERED:none  DRAINS: none   LOCAL MEDICATIONS USED:  MARCAINE     SPECIMEN:  No Specimen  DISPOSITION OF SPECIMEN:  N/A  COUNTS:  YES  TOURNIQUET:   Total Tourniquet Time Documented: Upper Arm (Left) - 37 minutes Total: Upper Arm (Left) - 37 minutes   DICTATION: .Viviann Spare Dictation  PLAN OF CARE: Discharge to home after PACU  PATIENT DISPOSITION:  PACU - hemodynamically stable.   Delay start of Pharmacological VTE agent (>24hrs) due to surgical blood loss or risk of bleeding: not applicable  The procedures and in the following manner. The patient was identified in the preop holding area evaluated and found to be stable for surgery. Her left index finger was marked at the point of maximal tenderness and site of the mass. After chart review she was taken to the operating room for Bier block. After sterile prep and drape timeout was completed.  A longitudinal incision was chosen to avoid the neurovascular bundle and this was made over the left index finger just proximal to the mid volar plane. Subcutaneous tissue was divided. Blunt dissection was carried out down to the bone where we found a slight prominence of the proximal phalanx and this was removed with a rongeur smoothed with a rasp covered with bone wax. The wound was then irrigated and closed with 3-0 interrupted nylon sutures. We injected 5 mL of Marcaine to assist with postoperative anesthesia  Sterile  dressing was applied  Tourniquet was released the hand returned to normal color and capillary refill was normal  She was then taken to the recovery room in stable condition

## 2015-04-01 NOTE — Anesthesia Procedure Notes (Addendum)
Procedure Name: MAC Date/Time: 04/01/2015 10:51 AM Performed by: Andree Elk, AMY A Pre-anesthesia Checklist: Patient identified, Timeout performed, Emergency Drugs available, Suction available and Patient being monitored Oxygen Delivery Method: Simple face mask   Anesthesia Regional Block:  Bier block (IV Regional)  Pre-Anesthetic Checklist: ,, timeout performed, Correct Patient, Correct Site, Correct Laterality, Correct Procedure,, site marked, surgical consent,, at surgeon's request Needles:  Injection technique: Single-shot  Needle Type: Other      Needle Gauge: 22 and 22 G    Additional Needles: Bier block (IV Regional)  Nerve Stimulator or Paresthesia:   Additional Responses:  Pulse checked post tourniquet inflation. IV NSL discontinued post injection. Narrative:  Start time: 04/01/2015 11:01 AM  Performed by: Personally

## 2015-04-01 NOTE — Anesthesia Postprocedure Evaluation (Signed)
  Anesthesia Post-op Note  Patient: Cynthia Parrish  Procedure(s) Performed: Procedure(s): EXCISION BONE SPUR  LEFT INDEX FINGER (Left)  Patient Location: PACU  Anesthesia Type:Bier block  Level of Consciousness: awake, alert  and oriented  Airway and Oxygen Therapy: Patient Spontanous Breathing  Post-op Pain: none  Post-op Assessment: Post-op Vital signs reviewed, Patient's Cardiovascular Status Stable, Respiratory Function Stable, Patent Airway and No signs of Nausea or vomiting  Post-op Vital Signs: Reviewed and stable  Last Vitals:  Filed Vitals:   04/01/15 1220  BP: 111/76  Pulse:   Temp:   Resp:     Complications: No apparent anesthesia complications

## 2015-04-01 NOTE — Interval H&P Note (Signed)
History and Physical Interval Note:  04/01/2015 10:36 AM  Cynthia Parrish  has presented today for surgery, with the diagnosis of MASS LEFT INDEX FINGER  The various methods of treatment have been discussed with the patient and family. After consideration of risks, benefits and other options for treatment, the patient has consented to  Procedure(s): EXCISION MASS LEFT INDEX FINGER (Left) as a surgical intervention .  The patient's history has been reviewed, patient examined, no change in status, stable for surgery.  I have reviewed the patient's chart and labs.  Questions were answered to the patient's satisfaction.     Arther Abbott

## 2015-04-01 NOTE — Transfer of Care (Signed)
Immediate Anesthesia Transfer of Care Note  Patient: Cynthia Parrish  Procedure(s) Performed: Procedure(s): EXCISION BONE SPUR  LEFT INDEX FINGER (Left)  Patient Location: PACU  Anesthesia Type:MAC  Level of Consciousness: awake, alert  and oriented  Airway & Oxygen Therapy: Patient Spontanous Breathing  Post-op Assessment: Report given to RN  Post vital signs: Reviewed  Last Vitals:  Filed Vitals:   04/01/15 1045  BP: 101/64  Temp:   Resp: 60    Complications: No apparent anesthesia complications

## 2015-04-01 NOTE — Brief Op Note (Signed)
04/01/2015  11:44 AM  PATIENT:  Cynthia Parrish  29 y.o. female  PRE-OPERATIVE DIAGNOSIS:  MASS LEFT INDEX FINGER  POST-OPERATIVE DIAGNOSIS:  BONE SPUR LEFT INDEX FINGER  Findings: prominent bone at base of proximal left index finger   PROCEDURE:  Procedure(s): EXCISION BONE SPUR  LEFT INDEX FINGER (Left)  SURGEON:  Surgeon(s) and Role:    * Carole Civil, MD - Primary  PHYSICIAN ASSISTANT:   ASSISTANTS: none   ANESTHESIA:   regional and spinal  EBL:  Total I/O In: 400 [I.V.:400] Out: -   BLOOD ADMINISTERED:none  DRAINS: none   LOCAL MEDICATIONS USED:  MARCAINE     SPECIMEN:  No Specimen  DISPOSITION OF SPECIMEN:  N/A  COUNTS:  YES  TOURNIQUET:   Total Tourniquet Time Documented: Upper Arm (Left) - 37 minutes Total: Upper Arm (Left) - 37 minutes   DICTATION: .Viviann Spare Dictation  PLAN OF CARE: Discharge to home after PACU  PATIENT DISPOSITION:  PACU - hemodynamically stable.   Delay start of Pharmacological VTE agent (>24hrs) due to surgical blood loss or risk of bleeding: not applicable  The procedures and in the following manner. The patient was identified in the preop holding area evaluated and found to be stable for surgery. Her left index finger was marked at the point of maximal tenderness and site of the mass. After chart review she was taken to the operating room for Bier block. After sterile prep and drape timeout was completed.  A longitudinal incision was chosen to avoid the neurovascular bundle and this was made over the left index finger just proximal to the mid volar plane. Subcutaneous tissue was divided. Blunt dissection was carried out down to the bone where we found a slight prominence of the proximal phalanx and this was removed with a rongeur smoothed with a rasp covered with bone wax. The wound was then irrigated and closed with 3-0 interrupted nylon sutures. We injected 5 mL of Marcaine to assist with postoperative anesthesia  Sterile  dressing was applied  Tourniquet was released the hand returned to normal color and capillary refill was normal  She was then taken to the recovery room in stable condition

## 2015-04-04 ENCOUNTER — Encounter (HOSPITAL_COMMUNITY): Payer: Self-pay | Admitting: Orthopedic Surgery

## 2015-04-04 ENCOUNTER — Ambulatory Visit (INDEPENDENT_AMBULATORY_CARE_PROVIDER_SITE_OTHER): Payer: 59 | Admitting: Orthopedic Surgery

## 2015-04-04 ENCOUNTER — Encounter: Payer: Self-pay | Admitting: Orthopedic Surgery

## 2015-04-04 DIAGNOSIS — R2232 Localized swelling, mass and lump, left upper limb: Secondary | ICD-10-CM

## 2015-04-04 NOTE — Progress Notes (Signed)
Patient ID: Cynthia Parrish, female   DOB: 09-Mar-1986, 29 y.o.   MRN: [id number] Chief Complaint  Patient presents with  . Wound Check    Surgery index finger left hand Friday, June 13    This is postoperative day #3 post of his #1 dressing change status post removal of bone from index finger left hand clean dry incision no sign of infection cover with Band-Aids okay to showers long she keeps it dry come back for suture removal on the 23rd

## 2015-04-11 ENCOUNTER — Encounter: Payer: Self-pay | Admitting: Orthopedic Surgery

## 2015-04-11 ENCOUNTER — Ambulatory Visit (INDEPENDENT_AMBULATORY_CARE_PROVIDER_SITE_OTHER): Payer: 59 | Admitting: Orthopedic Surgery

## 2015-04-11 VITALS — BP 116/69 | Ht 64.0 in | Wt 192.0 lb

## 2015-04-11 DIAGNOSIS — R2232 Localized swelling, mass and lump, left upper limb: Secondary | ICD-10-CM

## 2015-04-11 DIAGNOSIS — Z4889 Encounter for other specified surgical aftercare: Secondary | ICD-10-CM

## 2015-04-11 NOTE — Progress Notes (Signed)
Chief Complaint  Patient presents with  . Wound Check    Left index finger bone excision May 13    Encounter Diagnoses  Name Primary?  . Mass of finger of left hand   . Aftercare following surgery Yes    BP 116/69 mmHg  Ht 5\' 4"  (1.626 m)  Wt 192 lb (87.091 kg)  BMI 32.94 kg/m2  LMP 03/22/2015  Skin sutures removed wound slightly opened at the skin line. Skin seal place with Steri-Strips  Follow-up as needed   remove Steri-Strips Thursday

## 2015-04-11 NOTE — Patient Instructions (Signed)
Keep strips on  Ok to wash Thursday

## 2015-04-22 ENCOUNTER — Other Ambulatory Visit: Payer: Self-pay | Admitting: Orthopedic Surgery

## 2015-04-22 MED ORDER — CEPHALEXIN 500 MG PO CAPS: 500.0000 mg | ORAL_CAPSULE | Freq: Two times a day (BID) | ORAL | Status: DC

## 2015-05-24 ENCOUNTER — Other Ambulatory Visit: Payer: 59 | Admitting: Obstetrics and Gynecology

## 2015-06-06 ENCOUNTER — Other Ambulatory Visit: Payer: 59 | Admitting: Obstetrics and Gynecology

## 2015-06-20 ENCOUNTER — Other Ambulatory Visit: Payer: 59 | Admitting: Obstetrics and Gynecology

## 2015-07-04 ENCOUNTER — Other Ambulatory Visit (HOSPITAL_COMMUNITY)
Admission: RE | Admit: 2015-07-04 | Discharge: 2015-07-04 | Disposition: A | Discharge status: Home / Self Care | Payer: 59 | Source: Ambulatory Visit | Attending: Obstetrics and Gynecology | Admitting: Obstetrics and Gynecology

## 2015-07-04 ENCOUNTER — Encounter: Payer: Self-pay | Admitting: Obstetrics and Gynecology

## 2015-07-04 ENCOUNTER — Ambulatory Visit (INDEPENDENT_AMBULATORY_CARE_PROVIDER_SITE_OTHER): Payer: 59 | Admitting: Obstetrics and Gynecology

## 2015-07-04 VITALS — BP 98/60 | HR 72 | Ht 64.0 in | Wt 189.2 lb

## 2015-07-04 DIAGNOSIS — Z1151 Encounter for screening for human papillomavirus (HPV): Secondary | ICD-10-CM | POA: Insufficient documentation

## 2015-07-04 DIAGNOSIS — Z01419 Encounter for gynecological examination (general) (routine) without abnormal findings: Secondary | ICD-10-CM | POA: Insufficient documentation

## 2015-07-04 MED ORDER — ETONOGESTREL-ETHINYL ESTRADIOL 0.12-0.015 MG/24HR VA RING: VAGINAL_RING | VAGINAL | Status: DC

## 2015-07-04 NOTE — Progress Notes (Signed)
Patient ID: Cynthia Parrish, female   DOB: 1986-01-10, 29 y.o.   MRN: [id number]   Assessment:  Annual Gyn Exam Dysmenorrhea/Menorrhagia Contraception management. Pt requests refill of Nuvaring.  Plan:  1. pap smear done, next pap due in 3 years 2. return annually or prn 3    Annual mammogram advised 4.   Refill Nuvaring. Advised continuous method to reduce menstrual symptoms. Subjective:  Cynthia Parrish is a 29 y.o. female No obstetric history on file. who presents for annual exam. Patient's last menstrual period was 06/18/2015.  Patient requests a refill of Nuvaring. She states when she first used the Nuvaring, her periods were okay. However, since she started, her periods have become painful and heavy again as they used to be before she started Nuvaring. She complains of menorrhagia lasting for 7 days.   The following portions of the patient's history were reviewed and updated as appropriate: allergies, current medications, past family history, past medical history, past social history, past surgical history and problem list. History reviewed. No pertinent past medical history.  Past Surgical History  Procedure Laterality Date  . Tonsillectomy    . Mass excision Left 04/01/2015    Procedure: EXCISION BONE SPUR  LEFT INDEX FINGER;  Surgeon: Carole Civil, MD;  Location: [facility];  Service: Orthopedics;  Laterality: Left;     Current outpatient prescriptions:  .  etonogestrel-ethinyl estradiol (NUVARING) 0.12-0.015 MG/24HR vaginal ring, Insert vaginally and leave in place for 3 consecutive weeks, then remove for 1 week., Disp: 1 each, Rfl: 12 .  cephALEXin (KEFLEX) 500 MG capsule, Take 1 capsule (500 mg total) by mouth 2 (two) times daily. (Patient not taking: Reported on 07/04/2015), Disp: 20 capsule, Rfl: 1 .  HYDROcodone-acetaminophen (NORCO) 5-325 MG per tablet, Take 1 tablet by mouth every 6 (six) hours as needed for moderate pain. (Patient not taking: Reported on 04/11/2015), Disp:  36 tablet, Rfl: 0  Review of Systems Constitutional: negative Gastrointestinal: negative Genitourinary: menorrhagia, dysmenorrhea A complete 10 system review of systems was obtained and all systems are negative except as noted in the HPI and PMH.   Objective:  BP 98/60 mmHg  Pulse 72  Ht 5\' 4"  (1.626 m)  Wt 189 lb 3.2 oz (85.821 kg)  BMI 32.46 kg/m2  LMP 06/18/2015   BMI: Body mass index is 32.46 kg/(m^2).  General Appearance: Alert, appropriate appearance for age. No acute distress HEENT: Grossly normal Neck / Thyroid:  Cardiovascular: RRR; normal S1, S2, no murmur Lungs: CTA bilaterally Back: No CVAT Breast Exam: Slight irritation underneath right breast that pt attributes to her bra. No dimpling, nipple retraction or discharge. Normal to inspection, Normal breast tissue bilaterally  Gastrointestinal: Soft, non-tender, no masses or organomegaly Pelvic Exam: Vulva and vagina appear normal. Bimanual exam reveals normal uterus and adnexa. External genitalia: normal general appearance Urinary system: urethral meatus normal Vaginal: normal mucosa without prolapse or lesions, normal without tenderness, induration or masses and normal rugae Cervix: normal appearance Adnexa: normal bimanual exam Uterus: normal single, nontender Rectovaginal: not indicated Lymphatic Exam: Non-palpable nodes in neck, clavicular, axillary, or inguinal regions  Skin: no rash or abnormalities Neurologic: Normal gait and speech, no tremor   Psychiatric: Alert and oriented, appropriate affect.  Urinalysis:Not done  Cynthia Parrish. MD Pgr [phone] 12:12 PM     This chart was SCRIBED for Cynthia Shirk, MD by Stephania Fragmin, ED Scribe. This patient was seen in room 2, and the patient's care was started at 12:13 PM.  I personally  performed the services described in this documentation, which was SCRIBED in my presence. The recorded information has been reviewed and considered accurate. It has been edited  as necessary during review. Cynthia Kind, MD

## 2015-07-04 NOTE — Addendum Note (Signed)
Addended by: Doyne Keel on: 07/04/2015 03:38 PM   Modules accepted: Orders

## 2015-07-05 LAB — CYTOLOGY - PAP

## 2015-09-27 ENCOUNTER — Encounter (HOSPITAL_COMMUNITY): Payer: Self-pay | Admitting: *Deleted

## 2015-09-27 ENCOUNTER — Emergency Department (HOSPITAL_COMMUNITY)
Admission: EM | Admit: 2015-09-27 | Discharge: 2015-09-27 | Disposition: A | Discharge status: Home / Self Care | Payer: 59 | Attending: Emergency Medicine | Admitting: Emergency Medicine

## 2015-09-27 DIAGNOSIS — Z8719 Personal history of other diseases of the digestive system: Secondary | ICD-10-CM | POA: Diagnosis not present

## 2015-09-27 DIAGNOSIS — Z720 Tobacco use: Secondary | ICD-10-CM | POA: Insufficient documentation

## 2015-09-27 DIAGNOSIS — L508 Other urticaria: Secondary | ICD-10-CM

## 2015-09-27 DIAGNOSIS — L509 Urticaria, unspecified: Secondary | ICD-10-CM | POA: Insufficient documentation

## 2015-09-27 DIAGNOSIS — R21 Rash and other nonspecific skin eruption: Secondary | ICD-10-CM | POA: Diagnosis present

## 2015-09-27 HISTORY — DX: Gastro-esophageal reflux disease without esophagitis: K21.9

## 2015-09-27 MED ORDER — PREDNISONE 10 MG PO TABS: ORAL_TABLET | ORAL | Status: DC

## 2015-09-27 MED ORDER — PREDNISONE 50 MG PO TABS
60.0000 mg | ORAL_TABLET | Freq: Once | ORAL | Status: AC
  Administered 2015-09-27: 60 mg via ORAL
  Filled 2015-09-27 (×2): qty 1

## 2015-09-27 MED ORDER — HYDROXYZINE HCL 25 MG PO TABS: 25.0000 mg | ORAL_TABLET | Freq: Four times a day (QID) | ORAL | Status: DC | PRN

## 2015-09-27 NOTE — Discharge Instructions (Signed)
Hives Hives are itchy, red, swollen areas of the skin. They can vary in size and location on your body. Hives can come and go for hours or several days (acute hives) or for several weeks (chronic hives). Hives do not spread from person to person (noncontagious). They may get worse with scratching, exercise, and emotional stress. CAUSES   Allergic reaction to food, additives, or drugs.  Infections, including the common cold.  Illness, such as vasculitis, lupus, or thyroid disease.  Exposure to sunlight, heat, or cold.  Exercise.  Stress.  Contact with chemicals. SYMPTOMS   Red or white swollen patches on the skin. The patches may change size, shape, and location quickly and repeatedly.  Itching.  Swelling of the hands, feet, and face. This may occur if hives develop deeper in the skin. DIAGNOSIS  Your caregiver can usually tell what is wrong by performing a physical exam. Skin or blood tests may also be done to determine the cause of your hives. In some cases, the cause cannot be determined. TREATMENT  Mild cases usually get better with medicines such as antihistamines. Severe cases may require an emergency epinephrine injection. If the cause of your hives is known, treatment includes avoiding that trigger.  HOME CARE INSTRUCTIONS   Avoid causes that trigger your hives.  Take antihistamines as directed by your caregiver to reduce the severity of your hives. Non-sedating or low-sedating antihistamines are usually recommended. Do not drive while taking an antihistamine.  Take any other medicines prescribed for itching as directed by your caregiver.  Wear loose-fitting clothing.  Keep all follow-up appointments as directed by your caregiver. SEEK MEDICAL CARE IF:   You have persistent or severe itching that is not relieved with medicine.  You have painful or swollen joints. SEEK IMMEDIATE MEDICAL CARE IF:   You have a fever.  Your tongue or lips are swollen.  You have  trouble breathing or swallowing.  You feel tightness in the throat or chest.  You have abdominal pain. These problems may be the first sign of a life-threatening allergic reaction. Call your local emergency services (911 in [place] MAKE SURE YOU:   Understand these instructions.  Will watch your condition.  Will get help right away if you are not doing well or get worse.   This information is not intended to replace advice given to you by your health care provider. Make sure you discuss any questions you have with your health care provider.   Take your next dose to prednisone tomorrow afternoon.  Take your zantac twice daily as discussed which can also help hives resolve.  Try atarax in place of benadryl which may improve your itching since the benadryl is no longer working.

## 2015-09-27 NOTE — ED Notes (Signed)
Pt states she has hives on her arm, legs, and feet. Pt was seen by PCP and given prednisone and benadryl. Pt states she has constant itching and neither medication is helping. Denies n/v/d.

## 2015-09-27 NOTE — ED Provider Notes (Signed)
CSN: [id number]     Arrival date & time 09/27/15  1531 History   First MD Initiated Contact with Patient 09/27/15 1610     Chief Complaint  Patient presents with  . Urticaria     (Consider location/radiation/quality/duration/timing/severity/associated sxs/prior Treatment) The history is provided by the patient.   Cynthia Parrish is a 29 y.o. female no significant past medical history presenting with a three-day history of rash which has been itchy, migratory consistent with hives.  She denies prior history of similar symptoms and cannot identify any new exposures to foods, chemicals, soaps or lotions.  She was seen by her PCP yesterday at which time she received an IM injection of a steroid and has been taking Benadryl 25 mg every 6 hours with no relief of symptoms.  She denies shortness of breath, mouth, tongue or throat swelling.  She's been afebrile.  No new medications.  She did have nasal congestion and clear rhinorrhea several days ago and took Mucinex with resolution of these symptoms, has had this medication in the past without any allergic reaction.     Past Medical History  Diagnosis Date  . GERD (gastroesophageal reflux disease)    Past Surgical History  Procedure Laterality Date  . Tonsillectomy    . Mass excision Left 04/01/2015    Procedure: EXCISION BONE SPUR  LEFT INDEX FINGER;  Surgeon: Carole Civil, MD;  Location: [facility];  Service: Orthopedics;  Laterality: Left;   Family History  Problem Relation Age of Onset  . Hypertension Father   . Diabetes Father   . Cancer Brother     testicular   . Cancer Paternal Grandmother     breast    Social History  Substance Use Topics  . Smoking status: Current Every Day Smoker -- 0.50 packs/day for 7 years    Types: Cigarettes  . Smokeless tobacco: Never Used  . Alcohol Use: No     Comment: occ.   OB History    No data available     Review of Systems  Constitutional: Negative for fever and chills.  Respiratory:  Negative for shortness of breath and wheezing.   Skin: Positive for rash.  Neurological: Negative for numbness.      Allergies  Review of patient's allergies indicates no known allergies.  Home Medications   Prior to Admission medications   Medication Sig Start Date End Date Taking? Authorizing Provider  cephALEXin (KEFLEX) 500 MG capsule Take 1 capsule (500 mg total) by mouth 2 (two) times daily. Patient not taking: Reported on 07/04/2015 04/22/15   Carole Civil, MD  etonogestrel-ethinyl estradiol (NUVARING) 0.12-0.015 MG/24HR vaginal ring Insert vaginally and leave in place for 3 consecutive weeks, then replace with a new ring. (continuous method) 07/04/15   [name], MD  HYDROcodone-acetaminophen (NORCO) 5-325 MG per tablet Take 1 tablet by mouth every 6 (six) hours as needed for moderate pain. Patient not taking: Reported on 04/11/2015 04/01/15   Carole Civil, MD  hydrOXYzine (ATARAX/VISTARIL) 25 MG tablet Take 1-2 tablets (25-50 mg total) by mouth every 6 (six) hours as needed for itching. 09/27/15   Evalee Jefferson, PA-C  predniSONE (DELTASONE) 10 MG tablet 6, 5, 4, 3, 2 then 1 tablet by mouth daily for 6 days total. 09/27/15   Evalee Jefferson, PA-C   BP 127/77 mmHg  Pulse 82  Temp(Src) 97.9 F (36.6 C) (Oral)  Resp 18  Ht 5\' 4"  (1.626 m)  Wt 188 lb (85.276 kg)  BMI  32.25 kg/m2  SpO2 100%  LMP 09/10/2015 Physical Exam  Constitutional: She appears well-developed and well-nourished. No distress.  HENT:  Head: Normocephalic.  Neck: Neck supple.  Cardiovascular: Normal rate.   Pulmonary/Chest: Effort normal. She has no wheezes.  Musculoskeletal: Normal range of motion. She exhibits no edema.  Skin: Rash noted. Rash is urticarial.  Urticarial rash most prominent on upper arms shoulders and neck and upper chest.  More scattered lesions on legs and abdomen.    ED Course  Procedures (including critical care time) Labs Review Labs Reviewed - No data to display  Imaging  Review No results found. I have personally reviewed and evaluated these images and lab results as part of my medical decision-making.   EKG Interpretation None      MDM   Final diagnoses:  Urticaria, acute    Patient was placed on a prednisone 6 day taper, switch to Atarax in place of Benadryl.  Also recommended she take Zantac which she has at home twice a day for additional antihistamine relief.  Advise follow-up with her PCP in 2 days if not improving or return here if symptoms are worsening, especially if she develops any respiratory symptoms.  The patient appears reasonably screened and/or stabilized for discharge and I doubt any other medical condition or other [facility] requiring further screening, evaluation, or treatment in the ED at this time prior to discharge.     Evalee Jefferson, PA-C 09/27/15 [place], DO 09/30/15 [id number]

## 2015-09-27 NOTE — ED Notes (Signed)
PA at bedside.

## 2016-01-30 DIAGNOSIS — D485 Neoplasm of uncertain behavior of skin: Secondary | ICD-10-CM | POA: Diagnosis not present

## 2016-01-30 DIAGNOSIS — D235 Other benign neoplasm of skin of trunk: Secondary | ICD-10-CM | POA: Diagnosis not present

## 2016-03-13 DIAGNOSIS — J0100 Acute maxillary sinusitis, unspecified: Secondary | ICD-10-CM | POA: Diagnosis not present

## 2016-04-04 IMAGING — US US ABDOMEN COMPLETE
1 series · 14 of 25 positions shown · non-contrast
Comparison: None.

CLINICAL DATA: Epigastric pain

EXAM:
ULTRASOUND ABDOMEN COMPLETE

[Series 1: us abdomen complete · 0.21mm/px · 14 of 100 slices shown]
[im 1/100]
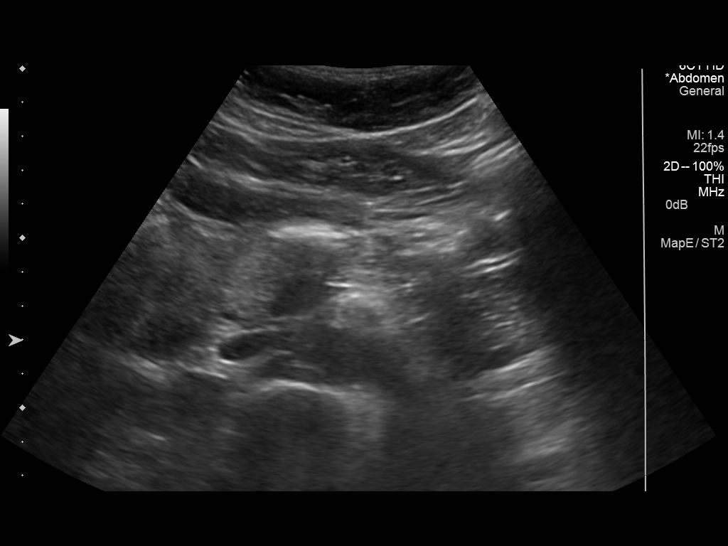
[im 9/100]
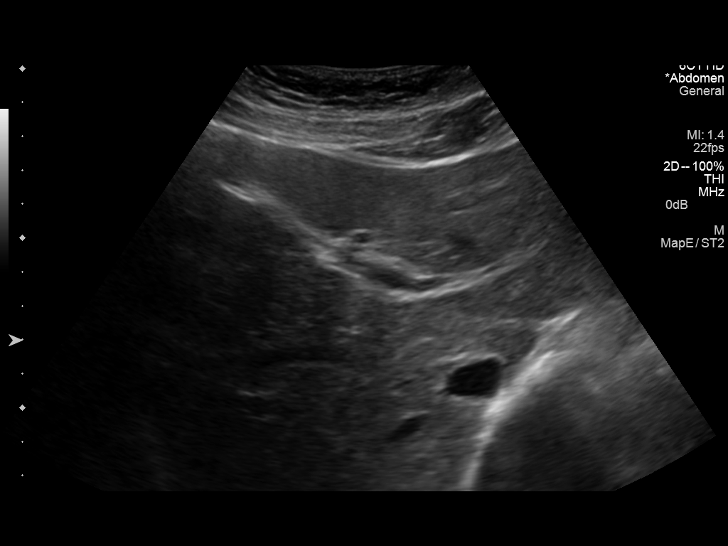
[im 17/100]
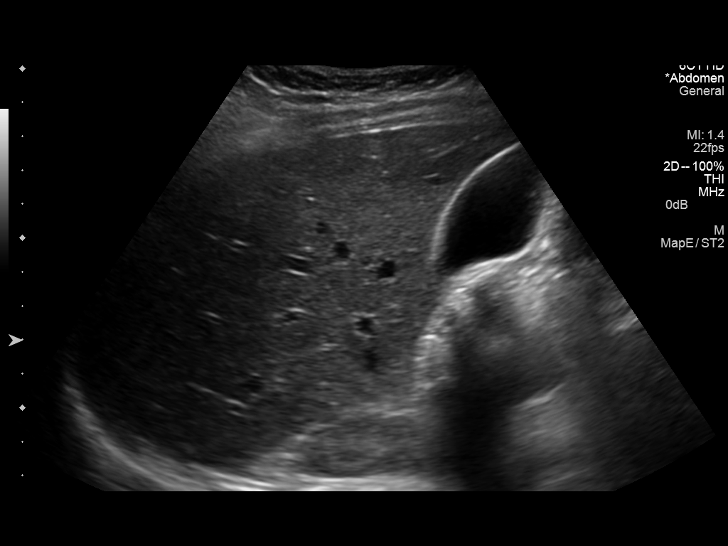
[im 25/100]
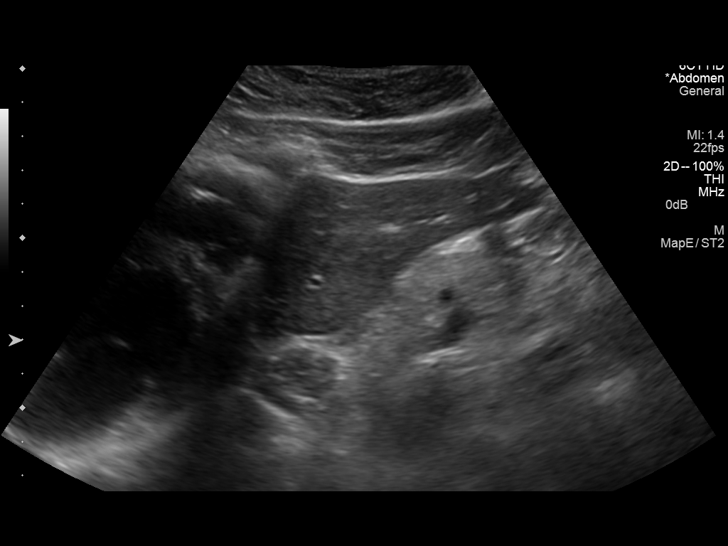
[im 34/100]
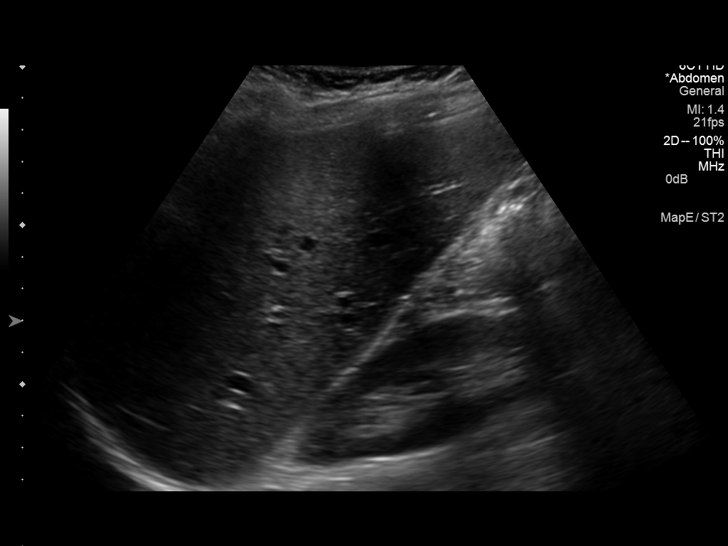
[im 38/100]
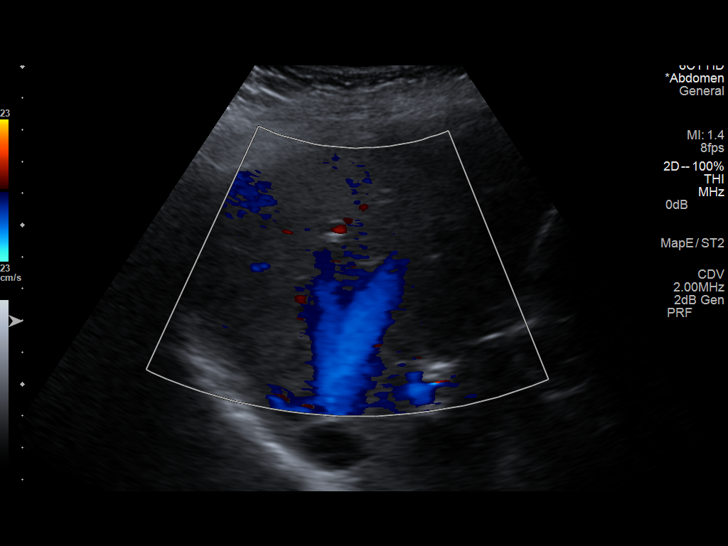
[im 46/100]
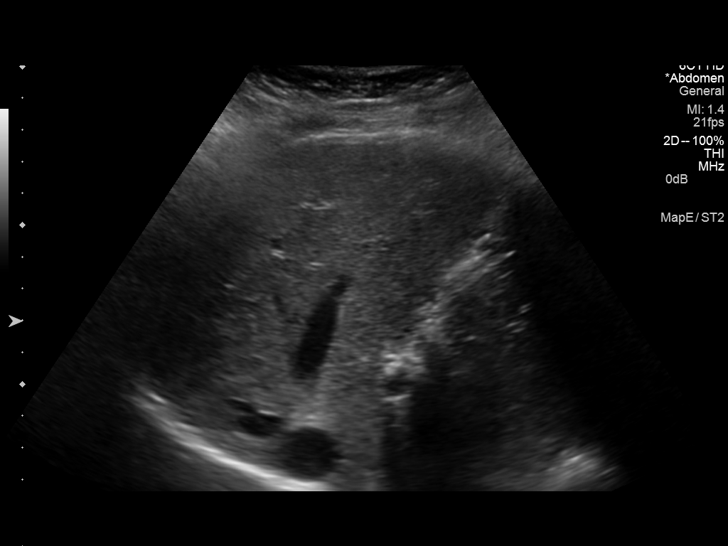
[im 54/100]
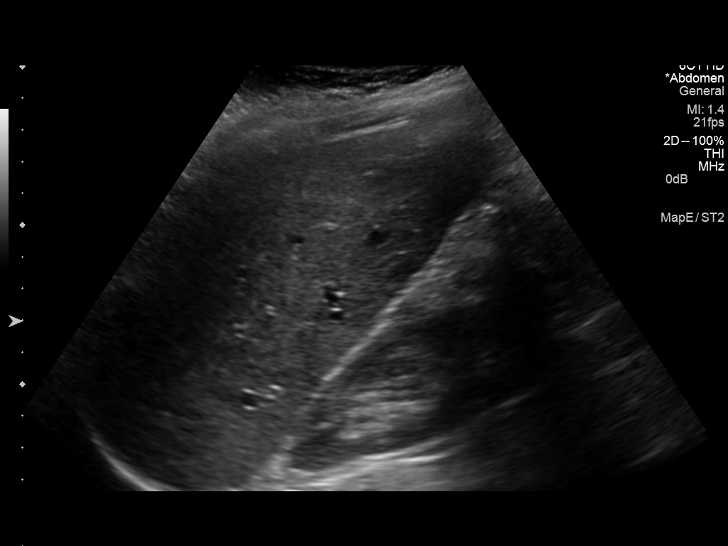
[im 62/100]
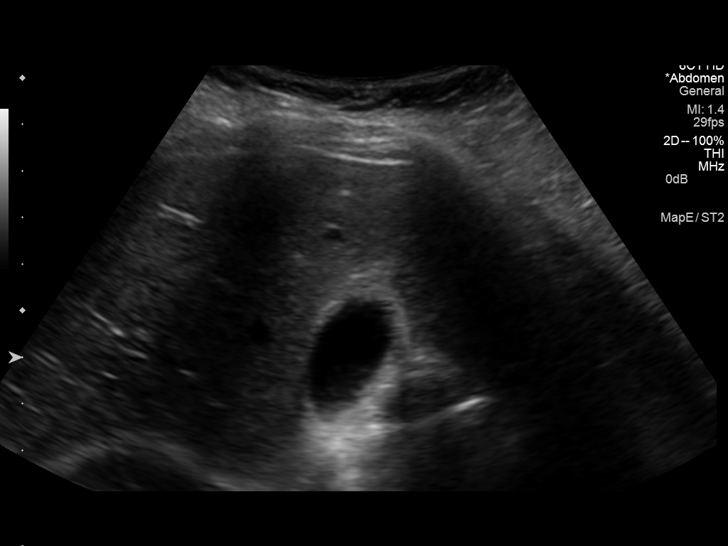
[im 67/100]
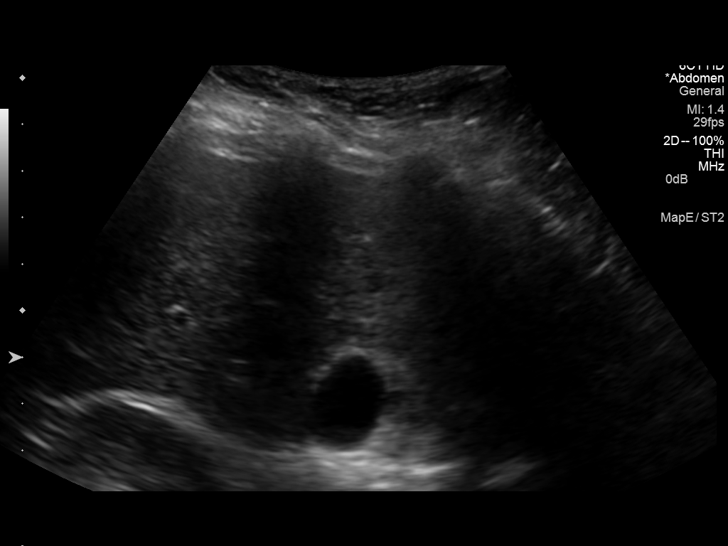
[im 75/100]
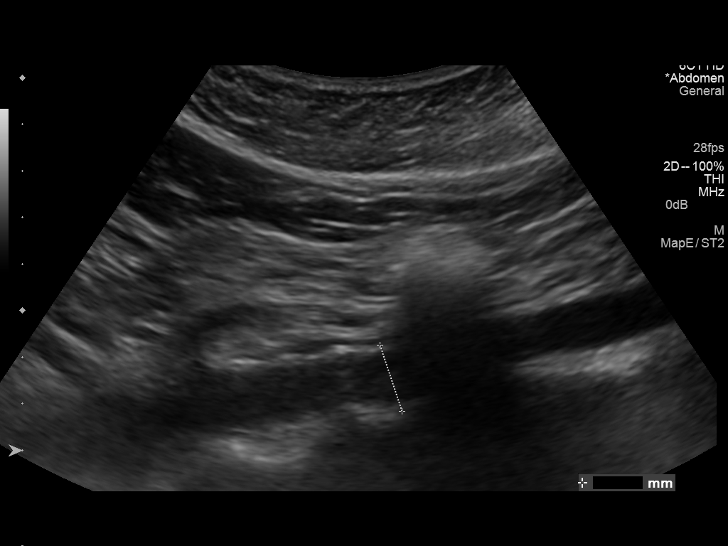
[im 83/100]
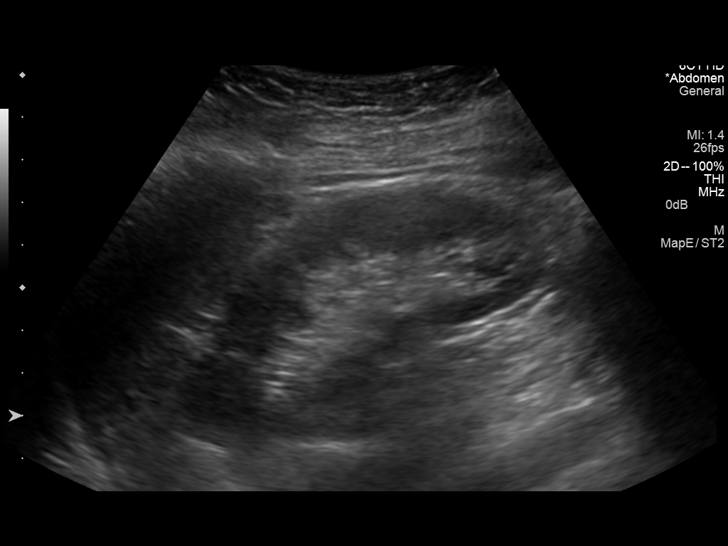
[im 91/100]
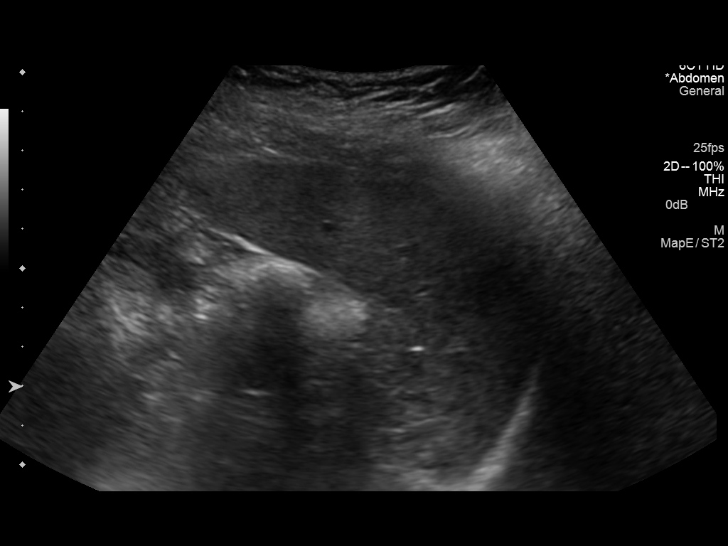
[im 100/100]
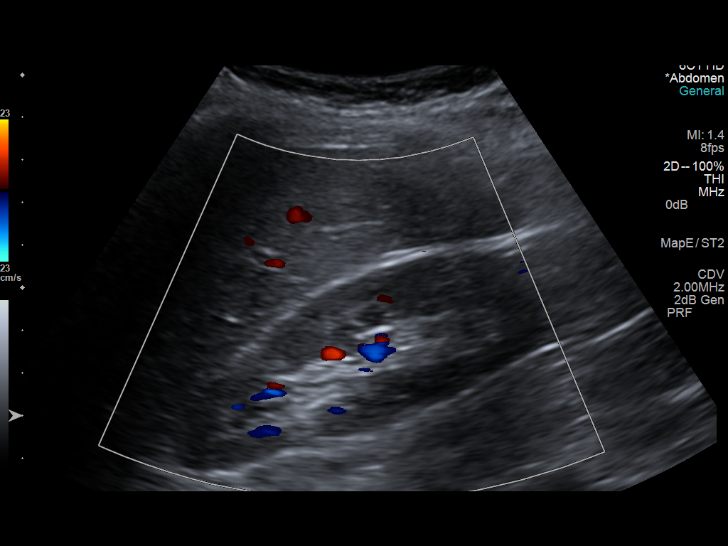

[14 of 25 positions shown; findings below may reference images not displayed]

FINDINGS: Gallbladder:

No gallstones or wall thickening visualized. No sonographic Murphy
sign noted.

Common bile duct:

Diameter: 4 mm

Liver:

No focal lesion identified. Within normal limits in parenchymal
echogenicity.

IVC:

No abnormality visualized.

Pancreas:

Visualized portion unremarkable.

Spleen:

Size and appearance within normal limits.

Right Kidney:

Length: 10 cm. Echogenicity within normal limits. No mass or
hydronephrosis visualized.

Left Kidney:

Length: 10 cm. Echogenicity within normal limits. No mass or
hydronephrosis visualized.

Abdominal aorta:

No aneurysm visualized.

Other findings:

None.
IMPRESSION: Normal abdomen ultrasound

## 2016-06-07 DIAGNOSIS — J029 Acute pharyngitis, unspecified: Secondary | ICD-10-CM | POA: Diagnosis not present

## 2016-06-07 DIAGNOSIS — R5383 Other fatigue: Secondary | ICD-10-CM | POA: Diagnosis not present

## 2016-06-07 DIAGNOSIS — R21 Rash and other nonspecific skin eruption: Secondary | ICD-10-CM | POA: Diagnosis not present

## 2016-06-07 DIAGNOSIS — Z6832 Body mass index (BMI) 32.0-32.9, adult: Secondary | ICD-10-CM | POA: Diagnosis not present

## 2016-06-07 DIAGNOSIS — L659 Nonscarring hair loss, unspecified: Secondary | ICD-10-CM | POA: Diagnosis not present

## 2016-06-30 DIAGNOSIS — Z6831 Body mass index (BMI) 31.0-31.9, adult: Secondary | ICD-10-CM | POA: Diagnosis not present

## 2016-06-30 DIAGNOSIS — J019 Acute sinusitis, unspecified: Secondary | ICD-10-CM | POA: Diagnosis not present

## 2016-07-11 ENCOUNTER — Other Ambulatory Visit: Payer: 59 | Admitting: Obstetrics and Gynecology

## 2016-07-17 ENCOUNTER — Other Ambulatory Visit: Payer: Self-pay | Admitting: Obstetrics and Gynecology

## 2016-07-20 ENCOUNTER — Encounter: Payer: Self-pay | Admitting: Obstetrics and Gynecology

## 2016-07-20 ENCOUNTER — Ambulatory Visit (INDEPENDENT_AMBULATORY_CARE_PROVIDER_SITE_OTHER): Payer: 59 | Admitting: Obstetrics and Gynecology

## 2016-07-20 VITALS — BP 128/78 | HR 66 | Ht 64.5 in | Wt 196.5 lb

## 2016-07-20 DIAGNOSIS — N920 Excessive and frequent menstruation with regular cycle: Secondary | ICD-10-CM | POA: Diagnosis not present

## 2016-07-20 MED ORDER — ETONOGESTREL-ETHINYL ESTRADIOL 0.12-0.015 MG/24HR VA RING: VAGINAL_RING | VAGINAL | 12 refills | Status: DC

## 2016-07-20 NOTE — Progress Notes (Signed)
   [facility] Visit  07/20/16           Patient name: Cynthia Parrish MRN [id number]  Date of birth: 14-Aug-1986  CC & HPI:  ADISYN KAMAN is a 30 y.o. female presenting today for menorrhagia, which has been increasingly worse over the last 6 months. States she is bleeding for 7 days each period. She uses Nuvaring for birth control; states she has tried using it continuously to avoid periods but states the bleeding continued, only lighter. She reports feeling cold for the days she is on her period.   ROS:  ROS +menorrhagia Negative otherwise stated in HPI and PMH  Pertinent History Reviewed:   Reviewed: Significant for  Medical         Past Medical History:  Diagnosis Date  . GERD (gastroesophageal reflux disease)                               Surgical Hx:    Past Surgical History:  Procedure Laterality Date  . MASS EXCISION Left 04/01/2015   Procedure: EXCISION BONE SPUR  LEFT INDEX FINGER;  Surgeon: Carole Civil, MD;  Location: [facility];  Service: Orthopedics;  Laterality: Left;  . TONSILLECTOMY     Medications: Reviewed & Updated - see associated section                       Current Outpatient Prescriptions:  .  hydrOXYzine (ATARAX/VISTARIL) 25 MG tablet, Take 1-2 tablets (25-50 mg total) by mouth every 6 (six) hours as needed for itching., Disp: 30 tablet, Rfl: 0 .  NUVARING 0.12-0.015 MG/24HR vaginal ring, INSERT ONE RING VAGINALLY AND LEAVE IN PLACE FOR 3 CONSECUTIVE WEEKS, THEN REPLACE WITH NEW RING, CONTINOUS METHOD, Disp: 1 each, Rfl: 12   Social History: Reviewed -  reports that she has been smoking Cigarettes.  She has a 5.50 pack-year smoking history. She has never used smokeless tobacco.  Objective Findings:  Vitals: Blood pressure 128/78, pulse 66, height 5' 4.5" (1.638 m), weight 196 lb 8 oz (89.1 kg), last menstrual period 07/16/2016.  Physical Examination: General appearance - alert, well appearing, and in no distress and oriented to person,  place, and time Mental status - alert, oriented to person, place, and time, normal mood, behavior, speech, dress, motor activity, and thought processes Pelvic - limited due to body habitus  VULVA: normal appearing vulva with no masses, tenderness or lesions,  VAGINA: normal appearing vagina with normal color and discharge, no lesions, moderate to heavy flow. CERVIX: normal appearing cervix without discharge or lesions, tiny in size UTERUS: anterior uterus is normal size, shape, consistency and nontender,  ADNEXA: normal adnexa in size, nontender and no masses   Assessment & Plan:   A:  1. Menorrhagia with nuvaring  P:  1. Continue with nuvaring  2. Transvaginal u/s next week    By signing my name below, I, Sonum Patel, attest that this documentation has been prepared under the direction and in the presence of [name], MD. Electronically Signed: Sonum Patel, Education administrator. 07/20/16. 11:56 AM.  I personally performed the services described in this documentation, which was SCRIBED in my presence. The recorded information has been reviewed and considered accurate. It has been edited as necessary during review. [name], MD

## 2016-07-24 ENCOUNTER — Other Ambulatory Visit: Payer: Self-pay | Admitting: Obstetrics and Gynecology

## 2016-07-24 DIAGNOSIS — N920 Excessive and frequent menstruation with regular cycle: Secondary | ICD-10-CM

## 2016-07-27 ENCOUNTER — Other Ambulatory Visit: Payer: 59

## 2016-08-03 ENCOUNTER — Other Ambulatory Visit: Payer: 59

## 2016-08-03 ENCOUNTER — Encounter: Payer: Self-pay | Admitting: *Deleted

## 2016-10-09 DIAGNOSIS — R1011 Right upper quadrant pain: Secondary | ICD-10-CM | POA: Diagnosis not present

## 2016-10-09 DIAGNOSIS — Z6832 Body mass index (BMI) 32.0-32.9, adult: Secondary | ICD-10-CM | POA: Diagnosis not present

## 2016-10-09 DIAGNOSIS — R1013 Epigastric pain: Secondary | ICD-10-CM | POA: Diagnosis not present

## 2016-12-20 IMAGING — CR DG HAND COMPLETE 3+V*L*
3 series · 3 of 3 positions shown · non-contrast
Comparison: None.

CLINICAL DATA: Mass left hand second MCP joint

EXAM:
LEFT HAND - COMPLETE 3+ VIEW

[view not recorded (1 of 3)]
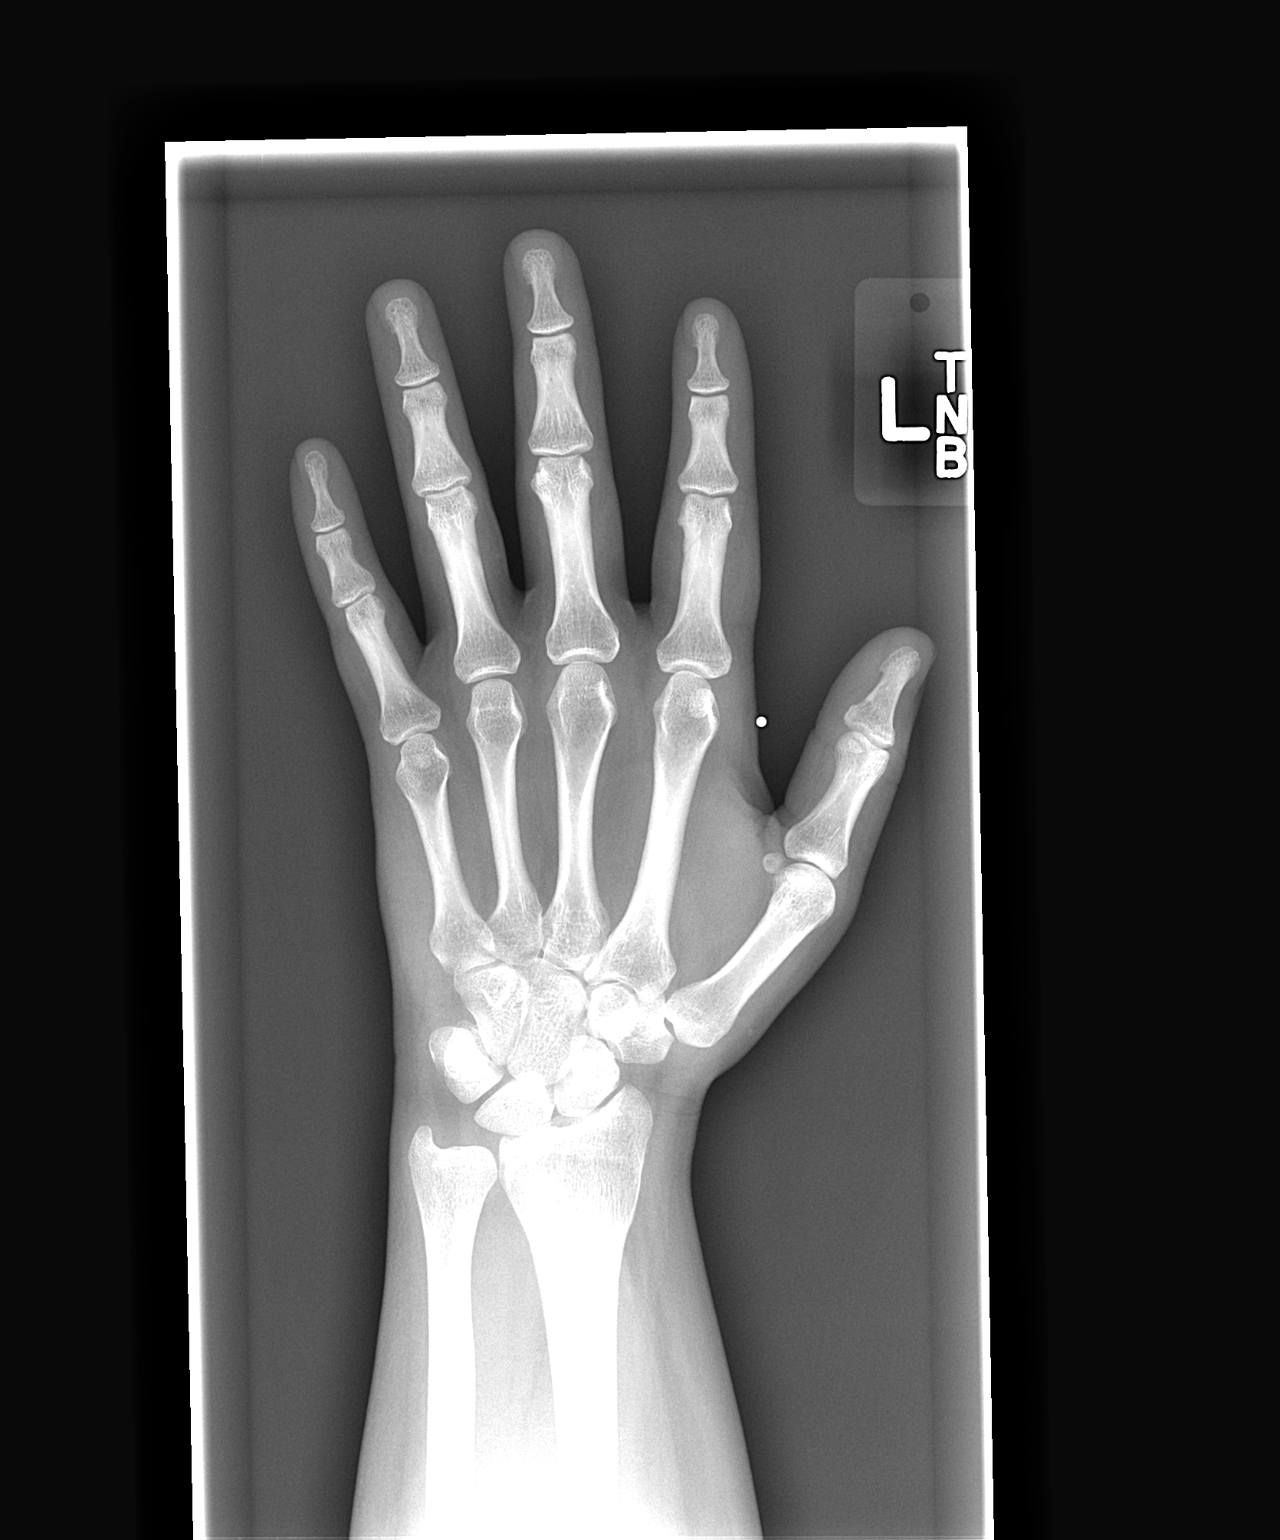

[view not recorded (2 of 3)]
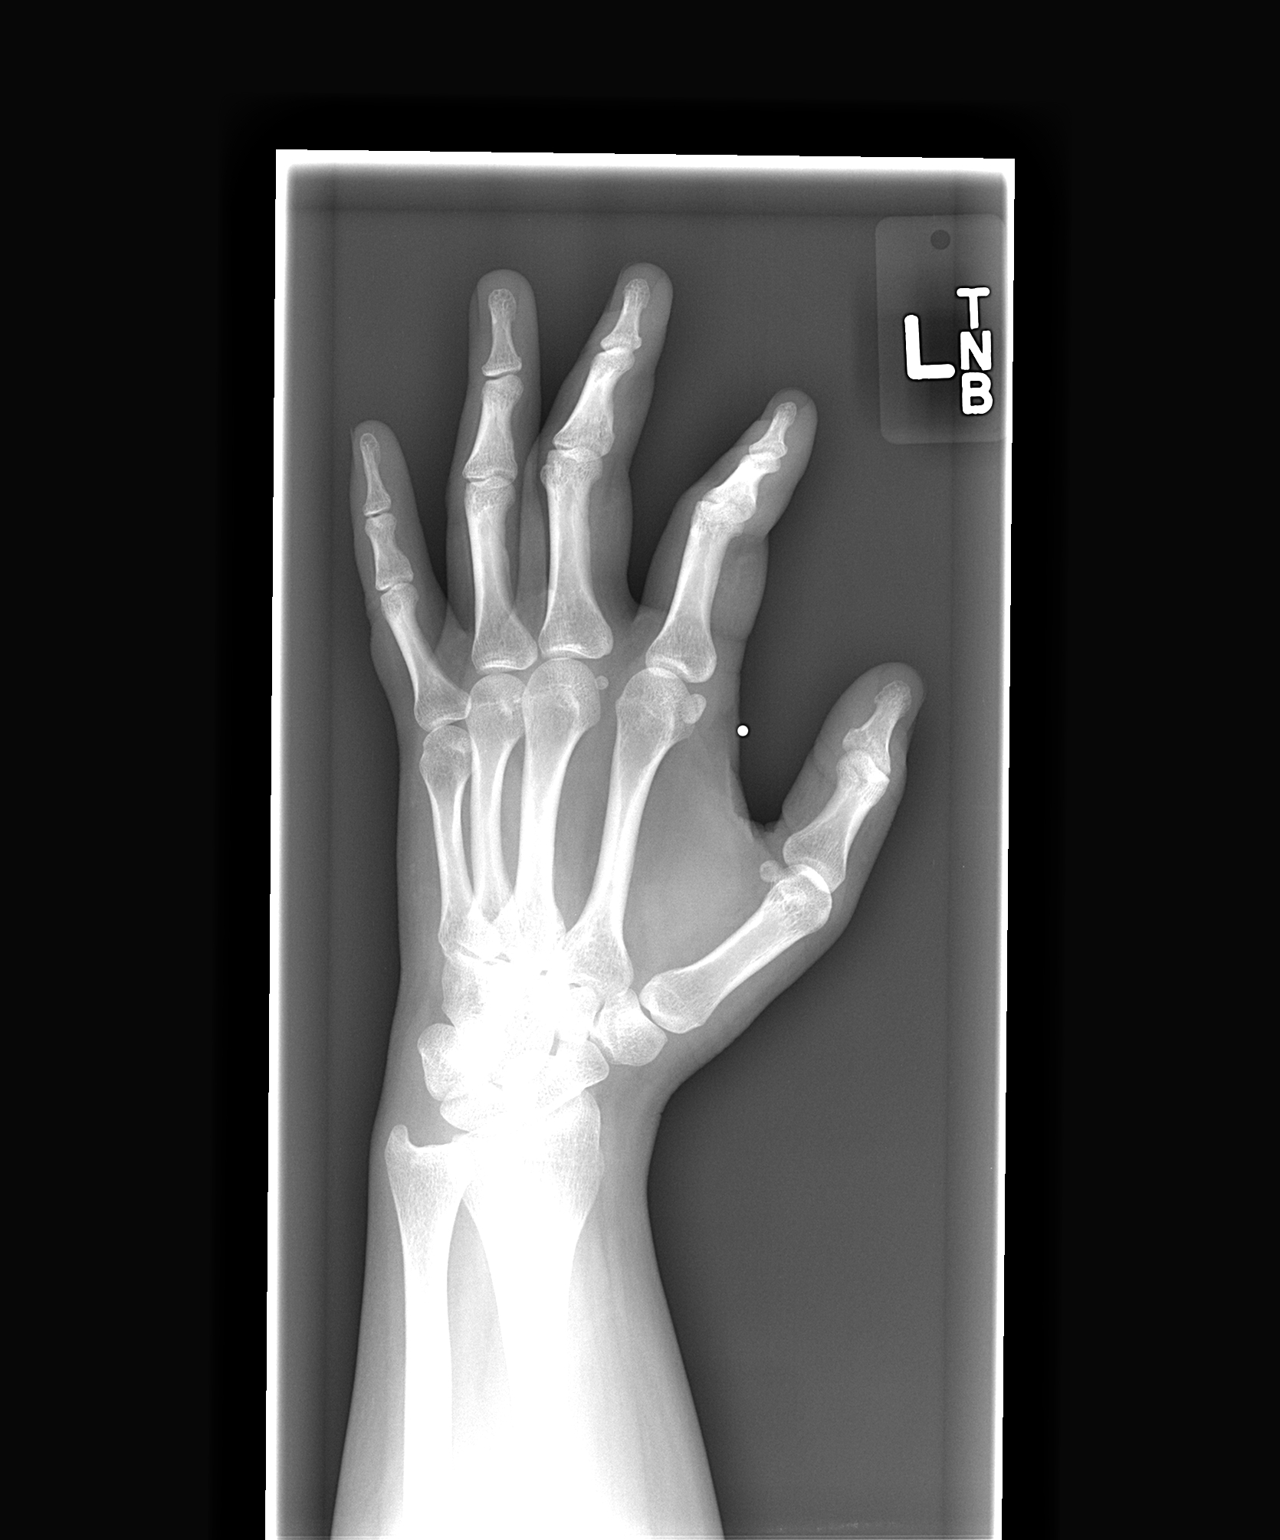

[view not recorded (3 of 3)]
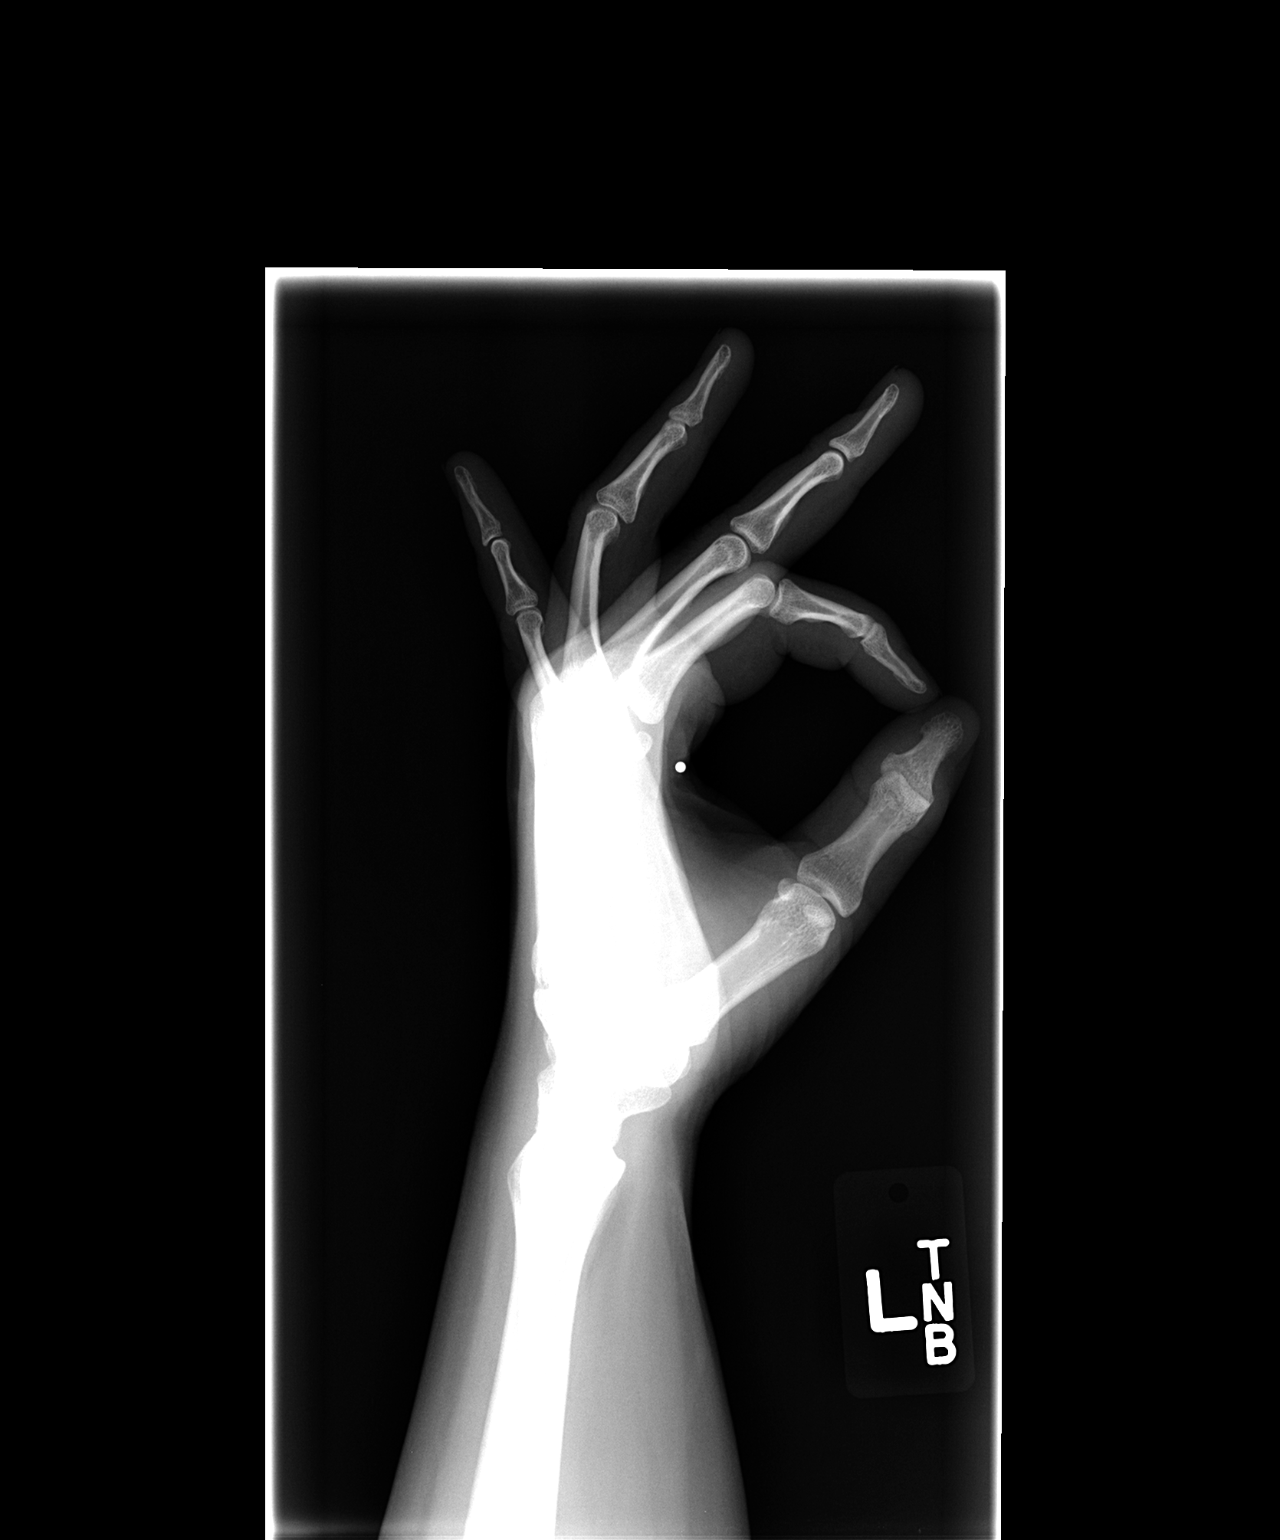

[3 of 3 positions shown; findings below may reference images not displayed]

FINDINGS: There is no evidence of fracture or dislocation. There is no
evidence of arthropathy or other focal bone abnormality. Soft
tissues are unremarkable.
IMPRESSION: Negative.

## 2017-03-04 DIAGNOSIS — Z6834 Body mass index (BMI) 34.0-34.9, adult: Secondary | ICD-10-CM | POA: Diagnosis not present

## 2017-03-04 DIAGNOSIS — J019 Acute sinusitis, unspecified: Secondary | ICD-10-CM | POA: Diagnosis not present

## 2017-03-06 ENCOUNTER — Telehealth: Payer: Self-pay | Admitting: Obstetrics & Gynecology

## 2017-03-06 NOTE — Telephone Encounter (Signed)
Spoke with pt letting her know her insurance company will need to send [place] a prior auth form and then we can work on prior [place] for [employer]. Pt states her insurance company sent her a paper. Pt to bring by office tomorrow and see if we can use that paper or if pharmacy will need to send prior auth paper. Pt voiced understanding.[place]

## 2017-06-25 DIAGNOSIS — Z6833 Body mass index (BMI) 33.0-33.9, adult: Secondary | ICD-10-CM | POA: Diagnosis not present

## 2017-06-25 DIAGNOSIS — R21 Rash and other nonspecific skin eruption: Secondary | ICD-10-CM | POA: Diagnosis not present

## 2017-06-25 DIAGNOSIS — M79672 Pain in left foot: Secondary | ICD-10-CM | POA: Diagnosis not present

## 2017-08-05 DIAGNOSIS — Z6833 Body mass index (BMI) 33.0-33.9, adult: Secondary | ICD-10-CM | POA: Diagnosis not present

## 2017-08-05 DIAGNOSIS — J0101 Acute recurrent maxillary sinusitis: Secondary | ICD-10-CM | POA: Diagnosis not present

## 2017-08-11 ENCOUNTER — Other Ambulatory Visit: Payer: Self-pay | Admitting: Obstetrics and Gynecology

## 2017-08-12 ENCOUNTER — Telehealth: Payer: Self-pay | Admitting: *Deleted

## 2017-08-12 NOTE — Telephone Encounter (Signed)
Spoke with pt. Pt states her Nuva Ring requires a PA. Advised to have pharmacy send the form. Also, pt was advised to schedule an appt for physical. Call was transferred to front desk for appt. [place]

## 2017-08-23 ENCOUNTER — Other Ambulatory Visit: Payer: 59 | Admitting: Obstetrics and Gynecology

## 2018-04-21 ENCOUNTER — Encounter: Payer: Self-pay | Admitting: Obstetrics and Gynecology

## 2018-04-21 ENCOUNTER — Other Ambulatory Visit (HOSPITAL_COMMUNITY)
Admission: RE | Admit: 2018-04-21 | Discharge: 2018-04-21 | Disposition: A | Discharge status: Home / Self Care | Payer: 59 | Source: Ambulatory Visit | Attending: Obstetrics and Gynecology | Admitting: Obstetrics and Gynecology

## 2018-04-21 ENCOUNTER — Ambulatory Visit (INDEPENDENT_AMBULATORY_CARE_PROVIDER_SITE_OTHER): Payer: 59 | Admitting: Obstetrics and Gynecology

## 2018-04-21 VITALS — BP 130/76 | Ht 64.5 in | Wt 200.4 lb

## 2018-04-21 DIAGNOSIS — Z01411 Encounter for gynecological examination (general) (routine) with abnormal findings: Secondary | ICD-10-CM | POA: Diagnosis not present

## 2018-04-21 DIAGNOSIS — Z3040 Encounter for surveillance of contraceptives, unspecified: Secondary | ICD-10-CM

## 2018-04-21 DIAGNOSIS — R51 Headache: Secondary | ICD-10-CM | POA: Diagnosis not present

## 2018-04-21 DIAGNOSIS — Z01419 Encounter for gynecological examination (general) (routine) without abnormal findings: Secondary | ICD-10-CM

## 2018-04-21 DIAGNOSIS — N920 Excessive and frequent menstruation with regular cycle: Secondary | ICD-10-CM

## 2018-04-21 MED ORDER — ETONOGESTREL-ETHINYL ESTRADIOL 0.12-0.015 MG/24HR VA RING: 1.0000 | VAGINAL_RING | VAGINAL | 5 refills | Status: DC

## 2018-04-21 NOTE — Progress Notes (Signed)
Subjective:     Cynthia Parrish is a 32 y.o. female here for a routine exam.  Current complaints: headaches 2 days before end of 21 day use of Nuvaring. Removed ring x 7 d/ month..  Personal health questionnaire reviewed: no.   Gynecologic History Patient's last menstrual period was 04/07/2018 (within days). Contraception: NuvaRing vaginal inserts Last Pap: 2016. Results were: normal Last mammogram: . Results were: n/a  Obstetric History OB History  Gravida Para Term Preterm AB Living  1 1   1   1   SAB TAB Ectopic Multiple Live Births          1    # Outcome Date GA Lbr Len/2nd Weight Sex Delivery Anes PTL Lv  1 Preterm 06/17/09 [redacted]w[redacted]d  6 lb 6 oz (2.892 kg) F Vag-Spont   LIV     The following portions of the patient's history were reviewed and updated as appropriate: past family history, past social history and problem list.  Review of Systems  Review of Systems  Constitutional: Negative for fever, chills, weight loss, malaise/fatigue and diaphoresis.  HENT: Negative for hearing loss, ear pain, nosebleeds, congestion, sore throat, neck pain, tinnitus and ear discharge.   Eyes: Negative for blurred vision, double vision, photophobia, pain, discharge and redness.  Respiratory: Negative for cough, hemoptysis, sputum production, shortness of breath, wheezing and stridor.   Cardiovascular: Negative for chest pain, palpitations, orthopnea, claudication, leg swelling and PND.  Gastrointestinal: negative for abdominal pain. Negative for heartburn, nausea, vomiting, diarrhea, constipation, blood in stool and melena.  Genitourinary: Negative for dysuria, urgency, frequency, hematuria and flank pain.  Musculoskeletal: Negative for myalgias, back pain, joint pain and falls.  Skin: Negative for itching and rash.  Neurological: Negative for dizziness, tingling, tremors, sensory change, speech change, focal weakness, seizures, loss of consciousness, weakness and headaches.  Endo/Heme/Allergies:  Negative for environmental allergies and polydipsia. Does not bruise/bleed easily.  Psychiatric/Behavioral: Negative for depression, suicidal ideas, hallucinations, memory loss and substance abuse. The patient is not nervous/anxious and does not have insomnia.        Objective:    Physical Exam  Vitals reviewed. Constitutional: She is oriented to person, place, and time. She appears well-developed and well-nourished.  HENT:  Head: Normocephalic and atraumatic.        Right Ear: External ear normal.  Left Ear: External ear normal.  Nose: Nose normal.  Mouth/Throat: Oropharynx is clear and moist.  Eyes: Conjunctivae and EOM are normal. Pupils are equal, round, and reactive to light. Right eye exhibits no discharge. Left eye exhibits no discharge. No scleral icterus.  Neck: Normal range of motion. Neck supple. No tracheal deviation present. No thyromegaly present.  Cardiovascular: Normal rate, regular rhythm, normal heart sounds and intact distal pulses.  Exam reveals no gallop and no friction rub.   No murmur heard. Respiratory: Effort normal and breath sounds normal. No respiratory distress. She has no wheezes. She has no rales. She exhibits no tenderness.  GI: Soft. Bowel sounds are normal. She exhibits no distension and no mass. There is no tenderness. There is no rebound and no guarding.  Genitourinary:       Vulva is normal without lesions Vagina is pink moist without discharge Cervix normal in appearance and pap is done Uterus is normal size shape and contour Adnexa is negative with normal sized ovaries by sonogram  Musculoskeletal: Normal range of motion. She exhibits no edema and no tenderness.  Neurological: She is alert and oriented to person, place, and  time. She has normal reflexes. She displays normal reflexes. No cranial nerve deficit. She exhibits normal muscle tone. Coordination normal.  Skin: Skin is warm and dry. No rash noted. No erythema. No pallor.  Psychiatric: She  has a normal mood and affect. Her behavior is normal. Judgment and thought content normal.       Assessment:    Healthy female exam.  contr management, pap   Plan:    Contraception: NuvaRing vaginal inserts. pt to go to continuous regimen, and if headache develops, she will switch Ring on Day 19 if needed.

## 2018-04-22 LAB — CYTOLOGY - PAP
Diagnosis: NEGATIVE
HPV: NOT DETECTED

## 2018-07-02 ENCOUNTER — Telehealth: Payer: 59 | Admitting: Family Medicine

## 2018-07-02 DIAGNOSIS — J069 Acute upper respiratory infection, unspecified: Secondary | ICD-10-CM | POA: Diagnosis not present

## 2018-07-02 NOTE — Progress Notes (Signed)
Cynthia Parrish,   I read your message and understand that you have been using multiple over the counter products with out any symptom relief or improvement. I believe a face to face visit would help delineate the cause of your symptoms that seem severe so early during the illness phase. This is the reason I believe you may benefit from a physical exam in person. If you are unable to get into your primary care I have listed some alternative locations for episodic care that have extended hours. I apologize we at e-visit could not be of more assistance today to help you with your concerns.   NOTE: If you entered your credit card information for this eVisit, you will not be charged. You may see a "hold" on your card for the $30 but that hold will drop off and you will not have a charge processed.  If you are having a true medical emergency please call 911.  If you need an urgent face to face visit, [place] has four urgent care centers for your convenience.  If you need care fast and have a high deductible or no insurance consider:   [email or web address] to reserve your spot online an avoid wait times  [facility] [address], [address] [place], [place] [address] 8 am to 8 pm Monday-Friday 10 am to 4 pm Saturday-Sunday *Across the street from [employer]  [place], [address] 8 am to 5 pm Monday-Friday * In the [facility] on the [facility]   The following sites will take your  insurance:  . [facility] [place] a Provider at this Location  [address] [place], [place] [address] . 10 am to 8 pm Monday-Friday . 12 pm to 8 pm Saturday-Sunday   . [facility] [place] a Provider at this Location  [address] [place], [place] [address] . 8 am to 8 pm Monday-Friday . 9 am to 6  pm Saturday . 11 am to 6 pm Sunday   . [facility] at [place] Get Driving Directions  [address] [place], [place] [address] . 8 am to 8 pm Monday-Friday . 8 am to 4 pm Saturday-Sunday   Your e-visit answers were reviewed by a board certified advanced clinical practitioner to complete your personal care plan.  Thank you for using e-Visits.

## 2018-07-02 NOTE — Progress Notes (Signed)
We are sorry that you are not feeling well.  Here is how we plan to help!  Based on your presentation I believe you most likely have A cough due to a virus.  This is called viral bronchitis and is best treated by rest, plenty of fluids and control of the cough.  You may use Ibuprofen or Tylenol as directed to help your symptoms.     In addition you may use A non-prescription cough medication called Mucinex DM: take 2 tablets every 12 hours.  If symptoms do not improve in 48 -72 hours please contact e-visit again or seek care for a face to face visit for further evaluation. Bacterial infections are typically diagnosed after 7-10 days of symptoms and are accompanied by fever.  From your responses in the eVisit questionnaire you describe inflammation in the upper respiratory tract which is causing a significant cough.  This is commonly called Bronchitis and has four common causes:    Allergies  Viral Infections  Acid Reflux  Bacterial Infection Allergies, viruses and acid reflux are treated by controlling symptoms or eliminating the cause. An example might be a cough caused by taking certain blood pressure medications. You stop the cough by changing the medication. Another example might be a cough caused by acid reflux. Controlling the reflux helps control the cough.    HOME CARE . Only take medications as instructed by your medical team. . Complete the entire course of an antibiotic. . Drink plenty of fluids and get plenty of rest. . Avoid close contacts especially the very young and the elderly . Cover your mouth if you cough or cough into your sleeve. . Always remember to wash your hands . A steam or ultrasonic humidifier can help congestion.   GET HELP RIGHT AWAY IF: . You develop worsening fever. . You become short of breath . You cough up blood. . Your symptoms persist after you have completed your treatment plan MAKE SURE YOU   Understand these instructions.  Will watch your  condition.  Will get help right away if you are not doing well or get worse.  Your e-visit answers were reviewed by a board certified advanced clinical practitioner to complete your personal care plan.  Depending on the condition, your plan could have included both over the counter or prescription medications. If there is a problem please reply  once you have received a response from your provider. Your safety is important to [place].  If you have drug allergies check your prescription carefully.    You can use MyChart to ask questions about today's visit, request a non-urgent call back, or ask for a work or school excuse for 24 hours related to this e-Visit. If it has been greater than 24 hours you will need to follow up with your provider, or enter a new e-Visit to address those concerns. You will get an e-mail in the next two days asking about your experience.  I hope that your e-visit has been valuable and will speed your recovery. Thank you for using e-visits.

## 2018-07-03 DIAGNOSIS — J209 Acute bronchitis, unspecified: Secondary | ICD-10-CM | POA: Diagnosis not present

## 2018-07-03 DIAGNOSIS — Z6833 Body mass index (BMI) 33.0-33.9, adult: Secondary | ICD-10-CM | POA: Diagnosis not present

## 2018-08-26 ENCOUNTER — Other Ambulatory Visit: Payer: Self-pay | Admitting: Obstetrics and Gynecology

## 2018-08-26 NOTE — Telephone Encounter (Signed)
refil Nuvaring x 3 ring, refil x 1 yr.

## 2018-08-27 ENCOUNTER — Telehealth: Payer: Self-pay | Admitting: *Deleted

## 2018-08-27 ENCOUNTER — Telehealth: Payer: Self-pay | Admitting: Obstetrics & Gynecology

## 2018-08-27 ENCOUNTER — Other Ambulatory Visit: Payer: Self-pay | Admitting: *Deleted

## 2018-08-27 MED ORDER — ETONOGESTREL-ETHINYL ESTRADIOL 0.12-0.015 MG/24HR VA RING: 1.0000 | VAGINAL_RING | VAGINAL | 4 refills | Status: DC

## 2018-08-27 NOTE — Telephone Encounter (Signed)
LMOVM that [place] script was sent in yesterday. Advised to call if further issues.

## 2018-08-27 NOTE — Telephone Encounter (Signed)
Pt called stating that she is having issues getting her nuvaring prescription filled and paid by insurance. She states that the pharmacy told her that the prescription needs to say to place ring for 21 days, leave out 1 week, and then replace with new ring. Advised pt that I would call pharmacy to see if I could get the issue resolved for her. Pt verbalized understanding.

## 2018-09-09 ENCOUNTER — Ambulatory Visit: Payer: 59 | Admitting: Obstetrics and Gynecology

## 2018-12-03 ENCOUNTER — Ambulatory Visit (INDEPENDENT_AMBULATORY_CARE_PROVIDER_SITE_OTHER): Payer: 59

## 2018-12-03 ENCOUNTER — Encounter: Payer: Self-pay | Admitting: Orthopedic Surgery

## 2018-12-03 ENCOUNTER — Ambulatory Visit: Payer: 59 | Admitting: Orthopedic Surgery

## 2018-12-03 VITALS — BP 117/72 | HR 86 | Ht 64.0 in | Wt 201.0 lb

## 2018-12-03 DIAGNOSIS — M1710 Unilateral primary osteoarthritis, unspecified knee: Secondary | ICD-10-CM | POA: Diagnosis not present

## 2018-12-03 DIAGNOSIS — G8929 Other chronic pain: Secondary | ICD-10-CM

## 2018-12-03 DIAGNOSIS — M25562 Pain in left knee: Secondary | ICD-10-CM | POA: Diagnosis not present

## 2018-12-03 DIAGNOSIS — M222X2 Patellofemoral disorders, left knee: Secondary | ICD-10-CM | POA: Diagnosis not present

## 2018-12-03 MED ORDER — MELOXICAM 7.5 MG PO TABS: 7.5000 mg | ORAL_TABLET | Freq: Every day | ORAL | 5 refills | Status: DC

## 2018-12-03 NOTE — Progress Notes (Signed)
Patient ID: Cynthia Parrish, female   DOB: Nov 03, 1986, 33 y.o.   MRN: [id number]  Chief Complaint  Patient presents with  . Knee Pain    Right knee pain, no injury.    33 year old female presents for evaluation of left knee pain which she has had for several years.  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  No major traumas to the knee.  Pain is worse after sitting it stiff in the morning it is relieved by ibuprofen and it pops   Review of Systems  Constitutional: Negative for chills and fever.  Skin: Negative.   Neurological: Negative for tingling.    Past Medical History:  Diagnosis Date  . GERD (gastroesophageal reflux disease)     Past Surgical History:  Procedure Laterality Date  . MASS EXCISION Left 04/01/2015   Procedure: EXCISION BONE SPUR  LEFT INDEX FINGER;  Surgeon: Cynthia Civil, MD;  Location: [facility];  Service: Orthopedics;  Laterality: Left;  . TONSILLECTOMY      PHYSICAL EXAM  BP 117/72   Pulse 86   Ht 5\' 4"  (1.626 m)   Wt 201 lb (91.2 kg)   LMP 11/19/2018   BMI 34.50 kg/m  GENERAL appearance reveals no gross abnormalities, normal development grooming and hygiene   MENTAL STATUS we note that the patient is awake alert and oriented to person place and time MOOD/AFFECT ARE NORMAL   GAIT reveals no limp in the effected limb  Right Knee Exam   Muscle Strength  The patient has normal right knee strength.  Tenderness  The patient is experiencing no tenderness.   Range of Motion  Extension:  -10 normal  Flexion: normal   Tests  McMurray:  Medial - negative Lateral - negative Varus: negative Valgus: negative Drawer:  Anterior - negative    Posterior - negative  Other  Erythema: absent Scars: absent Sensation: normal Pulse: present Swelling: none   Left Knee Exam   Muscle Strength  The patient has normal left knee strength.  Tenderness  The patient is experiencing no tenderness.    Range of Motion  Extension:  -10 normal  Flexion: normal   Tests  McMurray:  Medial - negative Lateral - negative Varus: negative Valgus: negative Drawer:  Anterior - negative     Posterior - negative  Other  Erythema: absent Scars: absent Sensation: normal Pulse: present Swelling: none     VASC 2+ dorsalis pedis pulse normal capillary refill excellent warmth to the extremity  NEURO normal sensation and no pathologic reflexes  LYMPH deferred noncontributory   MEDICAL DECISION SECTION  xrays ordered?  Yes My independent reading of xrays: Show that the patient has what I would consider excessive narrowing of the joint without secondary bone changes knee is still in valgus but medial side looks more narrow than lateral and both sides look more narrowed than usual     Encounter Diagnoses  Name Primary?  . Chronic pain of left knee Yes  . Patellofemoral pain syndrome of left knee   . Primary localized osteoarthritis of knee      PLAN:   Meds ordered this encounter  Medications  . meloxicam (MOBIC) 7.5 MG tablet    Sig: Take 1 tablet (7.5 mg total) by mouth daily.    Dispense:  30 tablet    Refill:  5   We will start with some anti-inflammatory medication.  We will see her in 6 months  if no improvement we can always proceed with more formalized physical therapy injections judiciously as needed

## 2018-12-19 ENCOUNTER — Encounter: Payer: Self-pay | Admitting: Obstetrics and Gynecology

## 2018-12-19 ENCOUNTER — Ambulatory Visit: Payer: 59 | Admitting: Obstetrics and Gynecology

## 2018-12-19 DIAGNOSIS — Z3202 Encounter for pregnancy test, result negative: Secondary | ICD-10-CM | POA: Diagnosis not present

## 2018-12-19 DIAGNOSIS — G43829 Menstrual migraine, not intractable, without status migrainosus: Secondary | ICD-10-CM | POA: Diagnosis not present

## 2018-12-19 LAB — POCT URINE PREGNANCY: Preg Test, Ur: NEGATIVE

## 2018-12-19 MED ORDER — ESTRADIOL 1 MG PO TABS: 1.0000 mg | ORAL_TABLET | Freq: Every day | ORAL | 3 refills | Status: DC

## 2018-12-19 NOTE — Progress Notes (Signed)
Patient ID: Shelda Altes, female   DOB: 1986/10/17, 33 y.o.   MRN: [id number]   [facility] Visit  @DATE @            Patient name: CAMILA MAITA MRN [id number]  Date of birth: 05/31/1986  CC & HPI:  WILHELMINA HARK is a 33 y.o. female presenting today for light period for 4 days instead of 7. Period blood is dark but light in flow. Migraine with period and night sweats only when she is on her period.  She takes the ring 3 weeks in and 1 week off. Migraines were worse when she has continuously had Nuvaring in. Takes ibuprofen constantly when she migraines during period. Knows that she doesn't do well on OCP and likes Nuvaring.  Preg test Neg  ROS:  ROS +migraines +night sweats -fever  All systems are negative except as noted in the HPI and PMH.    Pertinent History Reviewed:   Reviewed:  Medical         Past Medical History:  Diagnosis Date  . GERD (gastroesophageal reflux disease)                               Surgical Hx:    Past Surgical History:  Procedure Laterality Date  . MASS EXCISION Left 04/01/2015   Procedure: EXCISION BONE SPUR  LEFT INDEX FINGER;  Surgeon: Carole Civil, MD;  Location: [facility];  Service: Orthopedics;  Laterality: Left;  . TONSILLECTOMY     Medications: Reviewed & Updated - see associated section                       Current Outpatient Medications:  .  etonogestrel-ethinyl estradiol (NUVARING) 0.12-0.015 MG/24HR vaginal ring, Place 1 each vaginally every 21 ( twenty-one) days. Insert vaginally and leave in place for 3 consecutive weeks, then replace with new ring. ( continuous regimen), Disp: 3 each, Rfl: 4 .  meloxicam (MOBIC) 7.5 MG tablet, Take 1 tablet (7.5 mg total) by mouth daily., Disp: 30 tablet, Rfl: 5   Social History: Reviewed -  reports that she has been smoking cigarettes. She has a 5.50 pack-year smoking history. She has never used smokeless tobacco.  Objective Findings:  Vitals: Blood pressure 128/86, pulse (!) 104,  height 5\' 4"  (1.626 m), weight 201 lb (91.2 kg), last menstrual period 12/18/2018.  PHYSICAL EXAMINATION General appearance - alert, well appearing, and in no distress Mental status - alert, oriented to person, place, and time, normal mood, behavior, speech, dress, motor activity, and thought processes, affect appropriate to mood  PELVIC Not done. Discussion in office  Assessment & Plan:   A:  1.  Migraine during menses  P:  1.  Estradiol 1 mg daily, during week of period 2. F/u PRN  By signing my name below, I, Samul Dada, attest that this documentation has been prepared under the direction and in the presence of [name], MD. Electronically Signed: [place]. 12/19/18. 9:17 AM.  I personally performed the services described in this documentation, which was SCRIBED in my presence. The recorded information has been reviewed and considered accurate. It has been edited as necessary during review. [name], MD

## 2019-02-13 DIAGNOSIS — L299 Pruritus, unspecified: Secondary | ICD-10-CM | POA: Diagnosis not present

## 2019-06-05 ENCOUNTER — Ambulatory Visit: Payer: Self-pay | Admitting: Orthopedic Surgery

## 2019-06-17 ENCOUNTER — Ambulatory Visit: Payer: 59 | Admitting: Orthopedic Surgery

## 2019-06-17 ENCOUNTER — Encounter: Payer: Self-pay | Admitting: Orthopedic Surgery

## 2019-06-18 DIAGNOSIS — Z0001 Encounter for general adult medical examination with abnormal findings: Secondary | ICD-10-CM | POA: Diagnosis not present

## 2019-06-26 DIAGNOSIS — R739 Hyperglycemia, unspecified: Secondary | ICD-10-CM | POA: Diagnosis not present

## 2019-06-26 DIAGNOSIS — Z0000 Encounter for general adult medical examination without abnormal findings: Secondary | ICD-10-CM | POA: Diagnosis not present

## 2019-06-26 DIAGNOSIS — Z6834 Body mass index (BMI) 34.0-34.9, adult: Secondary | ICD-10-CM | POA: Diagnosis not present

## 2019-06-26 DIAGNOSIS — G4700 Insomnia, unspecified: Secondary | ICD-10-CM | POA: Diagnosis not present

## 2019-06-30 DIAGNOSIS — Z111 Encounter for screening for respiratory tuberculosis: Secondary | ICD-10-CM | POA: Diagnosis not present

## 2019-07-15 DIAGNOSIS — Z111 Encounter for screening for respiratory tuberculosis: Secondary | ICD-10-CM | POA: Diagnosis not present

## 2019-07-17 DIAGNOSIS — E785 Hyperlipidemia, unspecified: Secondary | ICD-10-CM | POA: Diagnosis not present

## 2019-07-17 DIAGNOSIS — Z23 Encounter for immunization: Secondary | ICD-10-CM | POA: Diagnosis not present

## 2019-07-17 DIAGNOSIS — Z0000 Encounter for general adult medical examination without abnormal findings: Secondary | ICD-10-CM | POA: Diagnosis not present

## 2019-07-17 DIAGNOSIS — I10 Essential (primary) hypertension: Secondary | ICD-10-CM | POA: Diagnosis not present

## 2019-07-17 DIAGNOSIS — Z111 Encounter for screening for respiratory tuberculosis: Secondary | ICD-10-CM | POA: Diagnosis not present

## 2019-07-17 DIAGNOSIS — R7309 Other abnormal glucose: Secondary | ICD-10-CM | POA: Diagnosis not present

## 2019-09-07 ENCOUNTER — Other Ambulatory Visit: Payer: Self-pay | Admitting: *Deleted

## 2019-09-07 MED ORDER — ETONOGESTREL-ETHINYL ESTRADIOL 0.12-0.015 MG/24HR VA RING: 1.0000 | VAGINAL_RING | VAGINAL | 4 refills | Status: DC

## 2019-09-08 ENCOUNTER — Other Ambulatory Visit: Payer: Self-pay | Admitting: *Deleted

## 2019-09-08 ENCOUNTER — Encounter: Payer: Self-pay | Admitting: *Deleted

## 2019-09-08 MED ORDER — ETONOGESTREL-ETHINYL ESTRADIOL 0.12-0.015 MG/24HR VA RING: 1.0000 | VAGINAL_RING | VAGINAL | 4 refills | Status: DC

## 2020-06-28 ENCOUNTER — Telehealth: Payer: 59 | Admitting: Physician Assistant

## 2020-06-28 DIAGNOSIS — M546 Pain in thoracic spine: Secondary | ICD-10-CM | POA: Diagnosis not present

## 2020-06-28 MED ORDER — TIZANIDINE HCL 2 MG PO TABS: 4.0000 mg | ORAL_TABLET | Freq: Three times a day (TID) | ORAL | 0 refills | Status: DC | PRN

## 2020-06-28 MED ORDER — NAPROXEN 500 MG PO TABS: 500.0000 mg | ORAL_TABLET | Freq: Two times a day (BID) | ORAL | 0 refills | Status: DC

## 2020-06-28 NOTE — Progress Notes (Signed)
We are sorry that you are not feeling well.  Here is how we plan to help!  Based on what you have shared with me it looks like you mostly have acute back pain.  Acute back pain is defined as musculoskeletal pain that can resolve in 1-3 weeks with conservative treatment.  I have prescribed Naprosyn 500 mg take one by mouth twice a day non-steroid anti-inflammatory (NSAID) as well as Tizanidine 2 mg every eight hours as needed which is a muscle relaxer.  I know you are already taking ibuprofen however, there is good evidence that scheduled anti-inflammatories will help with both pain and healing.  I recommend the Naprosyn. Some patients experience stomach irritation or in increased heartburn with anti-inflammatory drugs.  Please keep in mind that muscle relaxer's can cause fatigue and should not be taken while at work or driving.  Back pain is very common.  The pain often gets better over time.  The cause of back pain is usually not dangerous.  Most people can learn to manage their back pain on their own.  Home Care  Stay active.  Start with short walks on flat ground if you can.  Try to walk farther each day.  Do not sit, drive or stand in one place for more than 30 minutes.  Do not stay in bed.  Do not avoid exercise or work.  Activity can help your back heal faster.  Be careful when you bend or lift an object.  Bend at your knees, keep the object close to you, and do not twist.  Sleep on a firm mattress.  Lie on your side, and bend your knees.  If you lie on your back, put a pillow under your knees.  Only take medicines as told by your doctor.  Put ice on the injured area.  Put ice in a plastic bag  Place a towel between your skin and the bag  Leave the ice on for 15-20 minutes, 3-4 times a day for the first 2-3 days. 210 After that, you can switch between ice and heat packs.  Ask your doctor about back exercises or massage.  Avoid feeling anxious or stressed.  Find good ways to deal  with stress, such as exercise.  Get Help Right Way If:  Your pain does not go away with rest or medicine.  Your pain does not go away in 1 week.  You have new problems.  You do not feel well.  The pain spreads into your legs.  You cannot control when you poop (bowel movement) or pee (urinate)  You feel sick to your stomach (nauseous) or throw up (vomit)  You have belly (abdominal) pain.  You feel like you may pass out (faint).  If you develop a fever.  Make Sure you:  Understand these instructions.  Will watch your condition  Will get help right away if you are not doing well or get worse.  Your e-visit answers were reviewed by a board certified advanced clinical practitioner to complete your personal care plan.  Depending on the condition, your plan could have included both over the counter or prescription medications.  If there is a problem please reply  once you have received a response from your provider.  Your safety is important to [place].  If you have drug allergies check your prescription carefully.    You can use MyChart to ask questions about today's visit, request a non-urgent call back, or ask for a work or school excuse  for 24 hours related to this e-Visit. If it has been greater than 24 hours you will need to follow up with your provider, or enter a new e-Visit to address those concerns.  You will get an e-mail in the next two days asking about your experience.  I hope that your e-visit has been valuable and will speed your recovery. Thank you for using e-visits.  Greater than 5 minutes, yet less than 10 minutes of time have been spent researching, coordinating, and implementing care for this patient today

## 2020-08-01 DIAGNOSIS — R519 Headache, unspecified: Secondary | ICD-10-CM | POA: Diagnosis not present

## 2020-08-01 DIAGNOSIS — G4700 Insomnia, unspecified: Secondary | ICD-10-CM | POA: Diagnosis not present

## 2020-08-01 DIAGNOSIS — Z6834 Body mass index (BMI) 34.0-34.9, adult: Secondary | ICD-10-CM | POA: Diagnosis not present

## 2020-08-01 DIAGNOSIS — Z0000 Encounter for general adult medical examination without abnormal findings: Secondary | ICD-10-CM | POA: Diagnosis not present

## 2020-09-22 ENCOUNTER — Telehealth: Payer: Self-pay | Admitting: Women's Health

## 2020-09-22 NOTE — Telephone Encounter (Signed)
Called patient and left message to check her mychart.

## 2020-09-22 NOTE — Telephone Encounter (Signed)
Patient is out of nuvaring. Patient uses [facility] in [place] would like refill. Please advise if pt needs apt for refill

## 2020-09-23 ENCOUNTER — Other Ambulatory Visit: Payer: Self-pay | Admitting: *Deleted

## 2020-09-26 MED ORDER — ETONOGESTREL-ETHINYL ESTRADIOL 0.12-0.015 MG/24HR VA RING: 1.0000 | VAGINAL_RING | VAGINAL | 4 refills | Status: DC

## 2020-10-10 ENCOUNTER — Other Ambulatory Visit: Payer: Self-pay

## 2020-10-10 ENCOUNTER — Ambulatory Visit (INDEPENDENT_AMBULATORY_CARE_PROVIDER_SITE_OTHER): Payer: 59 | Admitting: Adult Health

## 2020-10-10 ENCOUNTER — Encounter: Payer: Self-pay | Admitting: Adult Health

## 2020-10-10 VITALS — BP 132/87 | HR 114 | Ht 64.0 in | Wt 209.0 lb

## 2020-10-10 DIAGNOSIS — Z01419 Encounter for gynecological examination (general) (routine) without abnormal findings: Secondary | ICD-10-CM | POA: Insufficient documentation

## 2020-10-10 DIAGNOSIS — Z3044 Encounter for surveillance of vaginal ring hormonal contraceptive device: Secondary | ICD-10-CM

## 2020-10-10 MED ORDER — ETONOGESTREL-ETHINYL ESTRADIOL 0.12-0.015 MG/24HR VA RING: 1.0000 | VAGINAL_RING | VAGINAL | 4 refills | Status: DC

## 2020-10-10 NOTE — Progress Notes (Signed)
Patient ID: Cynthia Parrish, female   DOB: 1986-03-07, 34 y.o.   MRN: [id number] History of Present Illness: Cynthia Parrish is a 34 year year old white female, single, G1P0101, in for a well woman gyn exam, she had a normal pap with negative HPV 04/21/18. PCP is [facility].   Current Medications, Allergies, Past Medical History, Past Surgical History, Family History and Social History were reviewed in [employer] record.     Review of Systems:  Patient denies any headaches, hearing loss, fatigue, blurred vision, shortness of breath, chest pain, abdominal pain, problems with bowel movements, urination, or intercourse. No joint pain or mood swings. Periods lighter and shorter and has hot flashes when ring out, could try for 25 days to see if helps   Physical Exam:BP 132/87 (BP Location: Left Arm, Patient Position: Sitting, Cuff Size: Normal)    Pulse (!) 114    Ht 5\' 4"  (1.626 m)    Wt 209 lb (94.8 kg)    LMP 09/26/2020 (Approximate)    BMI 35.87 kg/m  General:  Well developed, well nourished, no acute distress Skin:  Warm and dry Neck:  Midline trachea, normal thyroid, good ROM, no lymphadenopathy Lungs; Clear to auscultation bilaterally Breast:  No dominant palpable mass, retraction, or nipple discharge Cardiovascular: Regular rate and rhythm Abdomen:  Soft, non tender, no hepatosplenomegaly Pelvic:  External genitalia is normal in appearance, no lesions.  The vagina is normal in appearance.Ring in vault.  Urethra has no lesions or masses. The cervix is smooth.  Uterus is felt to be normal size, shape, and contour.  No adnexal masses or tenderness noted.Bladder is non tender, no masses felt. Extremities/musculoskeletal:  No swelling or varicosities noted, no clubbing or cyanosis Psych:  No mood changes, alert and cooperative,seems happy AA is 2 Fall risk is low PHQ 9 score is 4  [facility] - 10/10/20 1148      Pregnancy Intention Screening   Does the patient want to become  pregnant in the next year? No    Does the patient's partner want to become pregnant in the next year? No    Would the patient like to discuss contraceptive options today? No      Contraception Wrap Up   Current Method Vaginal Ring    End Method Vaginal Ring    Contraception Counseling Provided No         Examination chaperoned by angel RN  Impression and Plan: 1. Encounter for well woman exam with routine gynecological exam Pap and physical in 1 year Mammogram at 41  2. Encounter for surveillance of vaginal ring hormonal contraceptive device Meds ordered this encounter  Medications   etonogestrel-ethinyl estradiol (NUVARING) 0.12-0.015 MG/24HR vaginal ring    Sig: Place 1 each vaginally every 21 ( twenty-one) days. Insert vaginally and leave in place for 3 consecutive weeks, then replace with new ring. ( continuous regimen)    Dispense:  3 each    Refill:  4    Please consider 90 day supplies to promote better adherence    Order Specific Question:   Supervising Provider    Answer:   Tania Ade H [[id number]]

## 2020-11-11 ENCOUNTER — Telehealth: Payer: 59 | Admitting: Physician Assistant

## 2020-11-11 DIAGNOSIS — J069 Acute upper respiratory infection, unspecified: Secondary | ICD-10-CM | POA: Diagnosis not present

## 2020-11-11 MED ORDER — FLUTICASONE PROPIONATE 50 MCG/ACT NA SUSP: 2.0000 | Freq: Every day | NASAL | 0 refills | Status: DC

## 2020-11-11 MED ORDER — BENZONATATE 100 MG PO CAPS: 100.0000 mg | ORAL_CAPSULE | Freq: Three times a day (TID) | ORAL | 0 refills | Status: DC | PRN

## 2020-11-11 NOTE — Progress Notes (Signed)
We are sorry you are not feeling well.  Here is how we plan to help!  Based on what you have shared with me, it looks like you may have a viral upper respiratory infection.  Upper respiratory infections are caused by a large number of viruses; however, rhinovirus is the most common cause. Symptoms usually last 10 days.   Symptoms vary from person to person, with common symptoms including sore throat, cough, fatigue or lack of energy and feeling of general discomfort.  A low-grade fever of up to 100.4 may present, but is often uncommon.  Symptoms vary however, and are closely related to a person's age or underlying illnesses.  The most common symptoms associated with an upper respiratory infection are nasal discharge or congestion, cough, sneezing, headache and pressure in the ears and face.  These symptoms usually persist for about 3 to 10 days, but can last up to 2 weeks.  It is important to know that upper respiratory infections do not cause serious illness or complications in most cases.    Upper respiratory infections can be transmitted from person to person, with the most common method of transmission being a person's hands.  The virus is able to live on the skin and can infect other persons for up to 2 hours after direct contact.  Also, these can be transmitted when someone coughs or sneezes; thus, it is important to cover the mouth to reduce this risk.  To keep the spread of the illness at bay, good hand hygiene is very important.  This is an infection that is most likely caused by a virus. There are no specific treatments other than to help you with the symptoms until the infection runs its course.  We are sorry you are not feeling well.  Here is how we plan to help!   For nasal congestion, you may use an oral decongestants such as Mucinex D or if you have glaucoma or high blood pressure use plain Mucinex.  Saline nasal spray or nasal drops can help and can safely be used as often as needed for  congestion.  For your congestion, I have prescribed Fluticasone nasal spray one spray in each nostril twice a day  If you do not have a history of heart disease, hypertension, diabetes or thyroid disease, prostate/bladder issues or glaucoma, you may also use Sudafed to treat nasal congestion.  It is highly recommended that you consult with a pharmacist or your primary care physician to ensure this medication is safe for you to take.     If you have a cough, you may use cough suppressants such as Delsym and Robitussin.  If you have glaucoma or high blood pressure, you can also use Coricidin HBP.   For cough I have prescribed for you A prescription cough medication called Tessalon Perles 100 mg. You may take 1-2 capsules every 8 hours as needed for cough  If you have a sore or scratchy throat, use a saltwater gargle-  to  teaspoon of salt dissolved in a 4-ounce to 8-ounce glass of warm water.  Gargle the solution for approximately 15-30 seconds and then spit.  It is important not to swallow the solution.  You can also use throat lozenges/cough drops and Chloraseptic spray to help with throat pain or discomfort.  Warm or cold liquids can also be helpful in relieving throat pain.  For headache, pain or general discomfort, you can use Ibuprofen or Tylenol as directed.   Some authorities believe that zinc  sprays or the use of Echinacea may shorten the course of your symptoms.   HOME CARE . Only take medications as instructed by your medical team. . Be sure to drink plenty of fluids. Water is fine as well as fruit juices, sodas and electrolyte beverages. You may want to stay away from caffeine or alcohol. If you are nauseated, try taking small sips of liquids. How do you know if you are getting enough fluid? Your urine should be a pale yellow or almost colorless. . Get rest. . Taking a steamy shower or using a humidifier may help nasal congestion and ease sore throat pain. You can place a towel over your  head and breathe in the steam from hot water coming from a faucet. . Using a saline nasal spray works much the same way. . Cough drops, hard candies and sore throat lozenges may ease your cough. . Avoid close contacts especially the very young and the elderly . Cover your mouth if you cough or sneeze . Always remember to wash your hands.   GET HELP RIGHT AWAY IF: . You develop worsening fever. . If your symptoms do not improve within 10 days . You develop yellow or green discharge from your nose over 3 days. . You have coughing fits . You develop a severe head ache or visual changes. . You develop shortness of breath, difficulty breathing or start having chest pain . Your symptoms persist after you have completed your treatment plan  MAKE SURE YOU   Understand these instructions.  Will watch your condition.  Will get help right away if you are not doing well or get worse.  Your e-visit answers were reviewed by a board certified advanced clinical practitioner to complete your personal care plan. Depending upon the condition, your plan could have included both over the counter or prescription medications. Please review your pharmacy choice. If there is a problem, you may call our nursing hot line at and have the prescription routed to another pharmacy. Your safety is important to [place]. If you have drug allergies check your prescription carefully.   You can use MyChart to ask questions about today's visit, request a non-urgent call back, or ask for a work or school excuse for 24 hours related to this e-Visit. If it has been greater than 24 hours you will need to follow up with your provider, or enter a new e-Visit to address those concerns. You will get an e-mail in the next two days asking about your experience.  I hope that your e-visit has been valuable and will speed your recovery. Thank you for using e-visits.     Greater than 5 minutes, yet less than 10 minutes of time have been  spent researching, coordinating, and implementing care for this patient today

## 2020-11-28 ENCOUNTER — Encounter: Payer: Self-pay | Admitting: Cardiology

## 2020-11-28 ENCOUNTER — Ambulatory Visit (INDEPENDENT_AMBULATORY_CARE_PROVIDER_SITE_OTHER): Payer: 59 | Admitting: Cardiology

## 2020-11-28 ENCOUNTER — Encounter: Payer: Self-pay | Admitting: *Deleted

## 2020-11-28 VITALS — BP 135/91 | HR 112 | Ht 64.0 in | Wt 210.0 lb

## 2020-11-28 DIAGNOSIS — Z8249 Family history of ischemic heart disease and other diseases of the circulatory system: Secondary | ICD-10-CM | POA: Diagnosis not present

## 2020-11-28 DIAGNOSIS — R079 Chest pain, unspecified: Secondary | ICD-10-CM | POA: Diagnosis not present

## 2020-11-28 DIAGNOSIS — Z760 Encounter for issue of repeat prescription: Secondary | ICD-10-CM | POA: Diagnosis not present

## 2020-11-28 DIAGNOSIS — Z136 Encounter for screening for cardiovascular disorders: Secondary | ICD-10-CM

## 2020-11-28 DIAGNOSIS — I479 Paroxysmal tachycardia, unspecified: Secondary | ICD-10-CM | POA: Diagnosis not present

## 2020-11-28 NOTE — Patient Instructions (Addendum)
Medication Instructions:   Your physician recommends that you continue on your current medications as directed. Please refer to the Current Medication list given to you today.  Labwork:  Covid test 2-3 days before stress test. Please quarantine after covid test until stress test is completed.   Testing/Procedures: Your physician has requested that you have en exercise stress myoview. For further information please visit [email or web address]. Please follow instruction sheet, as given. Your physician has requested that you have an echocardiogram. Echocardiography is a painless test that uses sound waves to create images of your heart. It provides your doctor with information about the size and shape of your heart and how well your heart's chambers and valves are working. This procedure takes approximately one hour. There are no restrictions for this procedure.  Follow-Up:  Your physician recommends that you schedule a follow-up appointment in:   Any Other Special Instructions Will Be Listed Below (If Applicable).  If you need a refill on your cardiac medications before your next appointment, please call your pharmacy.

## 2020-11-28 NOTE — Progress Notes (Signed)
Cardiology Office Note  Date: 11/28/2020   ID: [name], DOB 1985-12-21, MRN [id number]  PCP:  Cynthia Labrum, MD  Cardiologist:  Cynthia Lesches, MD Electrophysiologist:  None   Chief Complaint  Patient presents with  . Postural tachycardia  . Chest discomfort    History of Present Illness: Cynthia Parrish is a 35 y.o. female referred for cardiology consultation by Cynthia Parrish at [facility] for the evaluation of elevated heart rate.  I reviewed the available records.  We discussed her symptoms today.  She states that since June 2021 she has noticed elevated heart rate when she stands.  She has a Apple watch, reports heart rate in the 40s when she gets up in the morning.  When she is up and moving around during the day her heart rate can get as fast as the 150s to 170s.  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  She does not report any syncope associated with tachycardia, does feel fatigued and short of breath.  She has also been experiencing intermittent episodes of left-sided chest discomfort, more recently an episode of left arm pain.  She does have a family history of CAD in her parents.  I personally reviewed her ECG today which shows normal sinus rhythm with decreased R wave progression and low voltage.  Orthostatic measurements were made.  Heart rate increased to a peak of 120 with standing, no drop in blood pressure, and heart rate change not clearly diagnostic of POTS.  Past Medical History:  Diagnosis Date  . Anxiety   . GERD (gastroesophageal reflux disease)   . Headache     Past Surgical History:  Procedure Laterality Date  . MASS EXCISION Left 04/01/2015   Procedure: EXCISION BONE SPUR  LEFT INDEX FINGER;  Surgeon: Carole Civil, MD;  Location: [facility];  Service: Orthopedics;  Laterality: Left;  . TONSILLECTOMY      Current Outpatient Medications  Medication Sig Dispense Refill  . etonogestrel-ethinyl estradiol (NUVARING) 0.12-0.015 MG/24HR vaginal  ring Place 1 each vaginally every 21 ( twenty-one) days. Insert vaginally and leave in place for 3 consecutive weeks, then replace with new ring. ( continuous regimen) 3 each 4  . hydrOXYzine (ATARAX/VISTARIL) 25 MG tablet Take 25 mg by mouth daily.     No current facility-administered medications for this visit.   Allergies:  Patient has no known allergies.   Social History: The patient  reports that she quit smoking about 5 months ago. Her smoking use included cigarettes. She has a 5.50 pack-year smoking history. She has never used smokeless tobacco. She reports current alcohol use. She reports that she does not use drugs.   Family History: The patient's family history includes Arthritis in her father; COPD in her father; Cancer in her brother and paternal grandmother; Congestive Heart Failure in her father; Diabetes in her father; Heart attack in her father; Hypertension in her father.   ROS: No orthopnea or PND.  Physical Exam: VS:  BP (!) 135/91 (BP Location: Right Arm, Cuff Size: Large)   Pulse (!) 112   Ht 5\' 4"  (1.626 m)   Wt 210 lb (95.3 kg)   SpO2 97%   BMI 36.05 kg/m , BMI Body mass index is 36.05 kg/m.  Wt Readings from Last 3 Encounters:  11/28/20 210 lb (95.3 kg)  10/10/20 209 lb (94.8 kg)  12/19/18 201 lb (91.2 kg)    General: Patient appears comfortable at rest. HEENT: Conjunctiva and lids normal,  wearing a mask. Neck: Supple, no elevated JVP or carotid bruits. Lungs: Clear to auscultation, nonlabored breathing at rest. Cardiac: Regular rate and rhythm, no S3 or significant systolic murmur, no pericardial rub. Extremities: No pitting edema, distal pulses 2+. Skin: Warm and dry. Musculoskeletal: No kyphosis. Neuropsychiatric: Alert and oriented x3, affect grossly appropriate.  ECG:  No old tracings available for review today.  Recent Labwork:  September 2021: Hemoglobin 13.4, platelets 251, BUN 8, creatinine 0.93, potassium 4.1, AST 11, ALT 10, cholesterol  164, triglycerides 145, HDL 37, LDL 101, TSH 1.64  Other Studies Reviewed Today:  No prior cardiac testing for review today.  Assessment and Plan:  1. Orthostatic tachycardia, no drop in blood pressure by measurements today, and heart rate change not clearly diagnostic of POTS.  Probably some element of autonomic dysfunction.  Recommended adequate hydration and exercise, hold off on starting beta-blocker at this time.  2. Recurrent chest discomfort, recent left arm discomfort with symptoms as well.  She has a family history of CAD in both parents.  Baseline ECG shows sinus rhythm with decreased R wave progression and low voltage.  Plan to obtain an echocardiogram to ensure normal cardiac structure and function, also an exercise Myoview for ischemic screening.  Medication Adjustments/Labs and Tests Ordered: Current medicines are reviewed at length with the patient today.  Concerns regarding medicines are outlined above.   Tests Ordered: Orders Placed This Encounter  Procedures  . NM Myocar Multi W/Spect W/Wall Motion / EF  . EKG 12-Lead  . ECHOCARDIOGRAM COMPLETE    Medication Changes: No orders of the defined types were placed in this encounter.   Disposition:  Follow up test results.  Signed, Satira Sark, MD, [facility] 11/28/2020 9:41 AM    [facility], [place], [place] [address] Phone: [phone]; Fax: [phone]

## 2020-12-06 ENCOUNTER — Other Ambulatory Visit: Payer: 59

## 2020-12-09 ENCOUNTER — Ambulatory Visit (HOSPITAL_COMMUNITY): Payer: 59

## 2020-12-16 ENCOUNTER — Other Ambulatory Visit (HOSPITAL_COMMUNITY): Payer: 59

## 2020-12-19 ENCOUNTER — Encounter (HOSPITAL_COMMUNITY): Payer: 59

## 2021-09-18 ENCOUNTER — Telehealth: Payer: Self-pay | Admitting: Family Medicine

## 2021-09-18 DIAGNOSIS — R6889 Other general symptoms and signs: Secondary | ICD-10-CM

## 2021-09-18 MED ORDER — OSELTAMIVIR PHOSPHATE 75 MG PO CAPS: 75.0000 mg | ORAL_CAPSULE | Freq: Two times a day (BID) | ORAL | 0 refills | Status: AC

## 2021-09-18 NOTE — Progress Notes (Signed)
E visit for Flu like symptoms   We are sorry that you are not feeling well.  Here is how we plan to help! Based on what you have shared with me it looks like you may have possible exposure to a virus that causes influenza.  Influenza or "the flu" is   an infection caused by a respiratory virus. The flu virus is highly contagious and persons who did not receive their yearly flu vaccination may "catch" the flu from close contact.  We have anti-viral medications to treat the viruses that cause this infection. They are not a "cure" and only shorten the course of the infection. These prescriptions are most effective when they are given within the first 2 days of "flu" symptoms. Antiviral medication are indicated if you have a high risk of complications from the flu. You should  also consider an antiviral medication if you are in close contact with someone who is at risk. These medications can help patients avoid complications from the flu  but have side effects that you should know. Possible side effects from Tamiflu or oseltamivir include nausea, vomiting, diarrhea, dizziness, headaches, eye redness, sleep problems or other respiratory symptoms. You should not take Tamiflu if you have an allergy to oseltamivir or any to the ingredients in Tamiflu.  Based upon your symptoms and potential risk factors I have prescribed Oseltamivir (Tamiflu).  It has been sent to your designated pharmacy.  You will take one 75 mg capsule orally twice a day for the next 5 days.  ANYONE WHO HAS FLU SYMPTOMS SHOULD: Stay home. The flu is highly contagious and going out or to work exposes others! Be sure to drink plenty of fluids. Water is fine as well as fruit juices, sodas and electrolyte beverages. You may want to stay away from caffeine or alcohol. If you are nauseated, try taking small sips of liquids. How do you know if you are getting enough fluid? Your urine should be a pale yellow or almost colorless. Get rest. Taking  a steamy shower or using a humidifier may help nasal congestion and ease sore throat pain. Using a saline nasal spray works much the same way. Cough drops, hard candies and sore throat lozenges may ease your cough. Line up a caregiver. Have someone check on you regularly.   GET HELP RIGHT AWAY IF: You cannot keep down liquids or your medications. You become short of breath Your fell like you are going to pass out or loose consciousness. Your symptoms persist after you have completed your treatment plan MAKE SURE YOU  Understand these instructions. Will watch your condition. Will get help right away if you are not doing well or get worse.  Your e-visit answers were reviewed by a board certified advanced clinical practitioner to complete your personal care plan.  Depending on the condition, your plan could have included both over the counter or prescription medications.  If there is a problem please reply  once you have received a response from your provider.  Your safety is important to us.  If you have drug allergies check your prescription carefully.    You can use MyChart to ask questions about today's visit, request a non-urgent call back, or ask for a work or school excuse for 24 hours related to this e-Visit. If it has been greater than 24 hours you will need to follow up with your provider, or enter a new e-Visit to address those concerns.  You will get an e-mail in the   next two days asking about your experience.  I hope that your e-visit has been valuable and will speed your recovery. Thank you for using e-visits.    I provided 5 minutes of non face-to-face time during this encounter for chart review, medication and order placement, as well as and documentation.   

## 2021-10-13 ENCOUNTER — Other Ambulatory Visit: Payer: Self-pay | Admitting: Adult Health

## 2021-10-20 ENCOUNTER — Other Ambulatory Visit: Payer: No Typology Code available for payment source | Admitting: Adult Health

## 2021-10-26 ENCOUNTER — Other Ambulatory Visit: Payer: Self-pay | Admitting: *Deleted

## 2021-10-26 MED ORDER — ETONOGESTREL-ETHINYL ESTRADIOL 0.12-0.015 MG/24HR VA RING: 1.0000 | VAGINAL_RING | VAGINAL | 4 refills | Status: DC

## 2021-12-01 ENCOUNTER — Ambulatory Visit (INDEPENDENT_AMBULATORY_CARE_PROVIDER_SITE_OTHER): Payer: 59 | Admitting: Adult Health

## 2021-12-01 ENCOUNTER — Encounter: Payer: Self-pay | Admitting: Adult Health

## 2021-12-01 ENCOUNTER — Other Ambulatory Visit (HOSPITAL_COMMUNITY)
Admission: RE | Admit: 2021-12-01 | Discharge: 2021-12-01 | Disposition: A | Discharge status: Home / Self Care | Payer: 59 | Source: Ambulatory Visit | Attending: Adult Health | Admitting: Adult Health

## 2021-12-01 ENCOUNTER — Other Ambulatory Visit: Payer: Self-pay

## 2021-12-01 VITALS — BP 115/80 | HR 96 | Ht 64.0 in | Wt 216.0 lb

## 2021-12-01 DIAGNOSIS — Z3044 Encounter for surveillance of vaginal ring hormonal contraceptive device: Secondary | ICD-10-CM | POA: Diagnosis not present

## 2021-12-01 DIAGNOSIS — Z01419 Encounter for gynecological examination (general) (routine) without abnormal findings: Secondary | ICD-10-CM

## 2021-12-01 DIAGNOSIS — G4489 Other headache syndrome: Secondary | ICD-10-CM | POA: Diagnosis not present

## 2021-12-01 MED ORDER — BUTALBITAL-APAP-CAFFEINE 50-325-40 MG PO TABS: 1.0000 | ORAL_TABLET | Freq: Four times a day (QID) | ORAL | 0 refills | Status: DC | PRN

## 2021-12-01 MED ORDER — ETONOGESTREL-ETHINYL ESTRADIOL 0.12-0.015 MG/24HR VA RING: 1.0000 | VAGINAL_RING | VAGINAL | 4 refills | Status: DC

## 2021-12-01 NOTE — Progress Notes (Signed)
Patient ID: Cynthia Parrish, female   DOB: 07-16-1986, 36 y.o.   MRN: [id number] History of Present Illness: Cynthia Parrish is a 36 year old white female,single, G1P0101, in for a well woman gyn exam and pap.  PCP is Dr Pleas Koch.  Current Medications, Allergies, Past Medical History, Past Surgical History, Family History and Social History were reviewed in [employer] record.     Review of Systems: Patient denies any daily headaches, hearing loss, fatigue, blurred vision, shortness of breath, chest pain, abdominal pain, problems with bowel movements, urination, or intercourse. No joint pain or mood swings.  +headaches before periods,usually wakes up with it,had one yesterday   Physical Exam:BP 115/80 (BP Location: Left Arm, Patient Position: Sitting, Cuff Size: Large)    Pulse 96    Ht 5\' 4"  (1.626 m)    Wt 216 lb (98 kg)    LMP 10/31/2021 (Approximate)    BMI 37.08 kg/m   General:  Well developed, well nourished, no acute distress Skin:  Warm and dry Neck:  Midline trachea, normal thyroid, good ROM, no lymphadenopathy Lungs; Clear to auscultation bilaterally Breast:  No dominant palpable mass, retraction, or nipple discharge Cardiovascular: Regular rate and rhythm Abdomen:  Soft, non tender, no hepatosplenomegaly Pelvic:  External genitalia is normal in appearance, no lesions.  The vagina is normal in appearance,+dark blood. Urethra has no lesions or masses. The cervix is bulbous.Pap with HR HPV genotyping performed.   Uterus is felt to be normal size, shape, and contour.  No adnexal masses or tenderness noted.Bladder is non tender, no masses felt. Rectal: Deferred  Extremities/musculoskeletal:  No swelling or varicosities noted, no clubbing or cyanosis Psych:  No mood changes, alert and cooperative,seems happy AA is 1 Fall risk is low Depression screen [facility] 2/9 12/01/2021 10/10/2020  Decreased Interest 0 0  Down, Depressed, Hopeless 1 0  PHQ - 2 Score 1 0  Altered sleeping  2 2  Tired, decreased energy 2 2  Change in appetite 0 0  Feeling bad or failure about yourself  0 0  Trouble concentrating 0 0  Moving slowly or fidgety/restless 0 0  Suicidal thoughts 0 0  PHQ-9 Score 5 4  Some encounter information is confidential and restricted. Go to Review Flowsheets activity to see all data.    GAD 7 : Generalized Anxiety Score 12/01/2021 10/10/2020  Nervous, Anxious, on Edge 1 0  Control/stop worrying 1 0  Worry too much - different things 1 0  Trouble relaxing 1 0  Restless 0 0  Easily annoyed or irritable 0 1  Afraid - awful might happen 0 0  Total GAD 7 Score 4 1      Upstream - 12/01/21 1141       Pregnancy Intention Screening   Does the patient want to become pregnant in the next year? No    Does the patient's partner want to become pregnant in the next year? No    Would the patient like to discuss contraceptive options today? No      Contraception Wrap Up   Current Method Vaginal Ring    End Method Vaginal Ring    Contraception Counseling Provided No            Examination chaperoned by Levy Pupa LPN  Impression and Plan: 1. Encounter for gynecological examination with Papanicolaou smear of cervix Pap sent Physical in 1 year Pap in 3 years if normal  2. Encounter for surveillance of vaginal ring hormonal contraceptive device Will  continue nuva ring, can try 25 days in and 3 out Meds ordered this encounter  Medications   etonogestrel-ethinyl estradiol (NUVARING) 0.12-0.015 MG/24HR vaginal ring    Sig: Place 1 each vaginally every 21 ( twenty-one) days. Insert vaginally and leave in place for 3 consecutive weeks, then replace with new ring. ( continuous regimen)    Dispense:  3 each    Refill:  4    Please consider 90 day supplies to promote better adherence    Order Specific Question:   Supervising Provider    Answer:   Florian Buff [[id number]]   butalbital-acetaminophen-caffeine (FIORICET) 50-325-40 MG tablet    Sig: Take 1-2  tablets by mouth every 6 (six) hours as needed for headache.    Dispense:  20 tablet    Refill:  0    Order Specific Question:   Supervising Provider    Answer:   Elonda Husky, LUTHER H [[id number]]     3. Headache associated with hormonal factors Will try Fioricet Meds ordered this encounter  Medications   etonogestrel-ethinyl estradiol (NUVARING) 0.12-0.015 MG/24HR vaginal ring    Sig: Place 1 each vaginally every 21 ( twenty-one) days. Insert vaginally and leave in place for 3 consecutive weeks, then replace with new ring. ( continuous regimen)    Dispense:  3 each    Refill:  4    Please consider 90 day supplies to promote better adherence    Order Specific Question:   Supervising Provider    Answer:   Florian Buff [[id number]]   butalbital-acetaminophen-caffeine (FIORICET) 50-325-40 MG tablet    Sig: Take 1-2 tablets by mouth every 6 (six) hours as needed for headache.    Dispense:  20 tablet    Refill:  0    Order Specific Question:   Supervising Provider    Answer:   Tania Ade H [[id number]]

## 2021-12-06 LAB — CYTOLOGY - PAP
Adequacy: ABSENT
Comment: NEGATIVE
Comment: NEGATIVE
Diagnosis: NEGATIVE
HPV 16: NEGATIVE
HPV 18 / 45: NEGATIVE
High risk HPV: POSITIVE — AB

## 2021-12-07 ENCOUNTER — Encounter: Payer: Self-pay | Admitting: Adult Health

## 2021-12-07 DIAGNOSIS — R87810 Cervical high risk human papillomavirus (HPV) DNA test positive: Secondary | ICD-10-CM | POA: Insufficient documentation

## 2021-12-07 HISTORY — DX: Cervical high risk human papillomavirus (HPV) DNA test positive: R87.810

## 2022-03-16 ENCOUNTER — Ambulatory Visit (INDEPENDENT_AMBULATORY_CARE_PROVIDER_SITE_OTHER): Payer: 59

## 2022-03-16 ENCOUNTER — Ambulatory Visit: Payer: 59

## 2022-03-16 ENCOUNTER — Ambulatory Visit: Payer: 59 | Admitting: Surgical

## 2022-03-16 DIAGNOSIS — M25561 Pain in right knee: Secondary | ICD-10-CM

## 2022-03-16 DIAGNOSIS — M25562 Pain in left knee: Secondary | ICD-10-CM

## 2022-03-16 DIAGNOSIS — D1622 Benign neoplasm of long bones of left lower limb: Secondary | ICD-10-CM | POA: Diagnosis not present

## 2022-03-16 DIAGNOSIS — M1712 Unilateral primary osteoarthritis, left knee: Secondary | ICD-10-CM

## 2022-03-18 ENCOUNTER — Encounter: Payer: Self-pay | Admitting: Surgical

## 2022-03-18 MED ORDER — BUPIVACAINE HCL 0.25 % IJ SOLN
4.0000 mL | INTRAMUSCULAR | Status: AC | PRN
  Administered 2022-03-16: 4 mL via INTRA_ARTICULAR

## 2022-03-18 MED ORDER — METHYLPREDNISOLONE ACETATE 40 MG/ML IJ SUSP
40.0000 mg | INTRAMUSCULAR | Status: AC | PRN
  Administered 2022-03-16: 40 mg via INTRA_ARTICULAR

## 2022-03-18 MED ORDER — LIDOCAINE HCL 1 % IJ SOLN
5.0000 mL | INTRAMUSCULAR | Status: AC | PRN
  Administered 2022-03-16: 5 mL

## 2022-03-18 NOTE — Progress Notes (Signed)
? ?Office Visit Note ?  ?Patient: Cynthia Parrish           ?Date of Birth: [birth year]           ?MRN: [id number] ?Visit Date: 03/16/2022 ?Requested by: Curlene Labrum, MD ?[address] ?[place],  [place] [address] ?PCP: Curlene Labrum, MD ? ?Subjective: ?Chief Complaint  ?Patient presents with  ? Right Knee - Pain  ? ? ?HPI: Cynthia Parrish is a 36 y.o. female who presents to the office complaining of bilateral knee pain, left greater than right.  Patient has a history of several years of knee pain.  States her left knee makes up about 60% of her pain in her right knee makes up 40%.  Pain has been worsening and she now wakes with pain on occasion.  She feels difficulty going from a sitting to standing position.  Denies any locking symptoms or instability.  She states she has a history of "bad joints" in her family with her dad having bad knees especially.  Denies any radicular pain, groin pain.  Localizes most of the pain to the medial aspect of both knees.  Feels like she cannot really run due to the pain.  Enjoys fishing and grilling as hobbies and works as a Psychologist, sport and exercise.  She has tried taking Tylenol and Advil with some relief.  She tried taking Mobic in the past that did not help her at all.  She is never had an injection in her knee.  No history of prior surgery to the knee.  She was previously seen by Dr. Aline Brochure several years ago. ?             ?ROS: All systems reviewed are negative as they relate to the chief complaint within the history of present illness.  Patient denies fevers or chills. ? ?Assessment & Plan: ?Visit Diagnoses:  ?1. Unilateral primary osteoarthritis, left knee   ?2. Pain in both knees, unspecified chronicity   ?3. Osteochondroma of left tibia   ? ? ?Plan: Patient is a 36 year old female who presents for evaluation of bilateral knee pain but primarily her left knee.  She has history of several years of knee pain without any history of injury.  Pain is worsening and now causing her pain  that occasionally keeps her up at night.  Based on her exam and radiographic findings, impression is likely a combination of intra-articular pathology and osteochondroma on the medial tibia causing her symptoms.  She is actually more tender over the area of the osteochondroma compared with the joint line on exam today.  She has a young daughter who is about 59 to 105 years old who has had surgery for similar osteochondroma due to pain.  Discussed the options available to patient.  Plan to try left knee injection to see how much relief she gets from this and how much of her pain is coming from some intra-articular pathology.  Follow-up in 4 weeks for clinical recheck with next steps based on her relief from that injection. ? ?Follow-Up Instructions: No follow-ups on file.  ? ?Orders:  ?Orders Placed This Encounter  ?Procedures  ? XR Knee 1-2 Views Right  ? XR KNEE 3 VIEW LEFT  ? ?No orders of the defined types were placed in this encounter. ? ? ? ? Procedures: ?Large Joint Inj: L knee on 03/16/2022 10:59 AM ?Indications: diagnostic evaluation, joint swelling and pain ?Details: 18 G 1.5 in needle, superolateral approach ? ?Arthrogram: No ? ?  Medications: 5 mL lidocaine 1 %; 40 mg methylPREDNISolone acetate 40 MG/ML; 4 mL bupivacaine 0.25 % ?Outcome: tolerated well, no immediate complications ?Procedure, treatment alternatives, risks and benefits explained, specific risks discussed. Consent was given by the patient. Immediately prior to procedure a time out was called to verify the correct patient, procedure, equipment, support staff and site/side marked as required. Patient was prepped and draped in the usual sterile fashion.  ? ? ? ? ?Clinical Data: ?No additional findings. ? ?Objective: ?Vital Signs: There were no vitals taken for this visit. ? ?Physical Exam:  ?Constitutional: Patient appears well-developed ?HEENT:  ?Head: Normocephalic ?Eyes:EOM are normal ?Neck: Normal range of motion ?Cardiovascular: Normal  rate ?Pulmonary/chest: Effort normal ?Neurologic: Patient is alert ?Skin: Skin is warm ?Psychiatric: Patient has normal mood and affect ? ?Ortho Exam: Ortho exam demonstrates left knee with no effusion.  Right knee with no effusion.  Left knee with about 3 degrees of hyperextension and 120 degrees of knee flexion.  Right knee with about 5 degrees hyperextension 120 degrees knee flexion.  No pain with hip range of motion bilaterally.  Negative Stinchfield sign bilaterally.  Tenderness over the medial joint line of both knees with no significant tenderness over the lateral joint line of either knee.  She is able to perform straight leg without extensor lag.  She ambulates without any significant antalgia.  Excellent stability to anterior and posterior drawer and varus/valgus stress at 0 30 degrees.  Negative Lachman exam bilaterally.  She has more tenderness over the medial proximal tibial shaft than she does over the medial joint line of the left knee. ? ?Specialty Comments:  ?No specialty comments available. ? ?Imaging: ?No results found. ? ? ?PMFS History: ?Patient Active Problem List  ? Diagnosis Date Noted  ? Papanicolaou smear of cervix with positive high risk human papilloma virus (HPV) test 12/07/2021  ? Headache associated with hormonal factors 12/01/2021  ? Encounter for gynecological examination with Papanicolaou smear of cervix 12/01/2021  ? Encounter for surveillance of vaginal ring hormonal contraceptive device 10/10/2020  ? Encounter for well woman exam with routine gynecological exam 10/10/2020  ? Menorrhagia with regular cycle 07/20/2016  ? Cyst of bone   ? Chondromalacia of both patellae 04/15/2014  ? Well woman exam with routine gynecological exam 08/12/2013  ? ?Past Medical History:  ?Diagnosis Date  ? Anxiety   ? GERD (gastroesophageal reflux disease)   ? Headache   ? Papanicolaou smear of cervix with positive high risk human papilloma virus (HPV) test 12/07/2021  ? 12/07/21 repeat in 1 year per  ASCCP guideline  5 year risk for CIN 3+ is 2.25.%  ?  ?Family History  ?Problem Relation Age of Onset  ? Cancer Paternal Grandmother   ?     breast   ? Hypertension Father   ? Diabetes Father   ? Arthritis Father   ? COPD Father   ? Congestive Heart Failure Father   ? Heart attack Father   ? Other Mother   ?     open heart surgery  ? Cancer Brother   ?     testicular   ?  ?Past Surgical History:  ?Procedure Laterality Date  ? MASS EXCISION Left 04/01/2015  ? Procedure: EXCISION BONE SPUR  LEFT INDEX FINGER;  Surgeon: Carole Civil, MD;  Location: [facility];  Service: Orthopedics;  Laterality: Left;  ? TONSILLECTOMY    ? ?Social History  ? ?Occupational History  ? Not on file  ?Tobacco Use  ?  Smoking status: Former  ?  Packs/day: 0.50  ?  Years: 11.00  ?  Pack years: 5.50  ?  Types: Cigarettes  ?  Quit date: 06/19/2020  ?  Years since quitting: 1.7  ? Smokeless tobacco: Never  ?Vaping Use  ? Vaping Use: Never used  ?Substance and Sexual Activity  ? Alcohol use: Yes  ?  Comment: socially  ? Drug use: No  ? Sexual activity: Not Currently  ?  Birth control/protection: Inserts  ? ? ? ? ?  ?

## 2022-03-19 ENCOUNTER — Other Ambulatory Visit: Payer: Self-pay | Admitting: Adult Health

## 2022-03-19 MED ORDER — BUTALBITAL-APAP-CAFFEINE 50-325-40 MG PO TABS
1.0000 | ORAL_TABLET | Freq: Four times a day (QID) | ORAL | 0 refills | Status: DC | PRN
Start: 2022-03-19 — End: 2022-12-14

## 2022-04-13 ENCOUNTER — Ambulatory Visit (INDEPENDENT_AMBULATORY_CARE_PROVIDER_SITE_OTHER): Payer: 59 | Admitting: Orthopedic Surgery

## 2022-04-13 ENCOUNTER — Encounter: Payer: Self-pay | Admitting: Orthopedic Surgery

## 2022-04-13 DIAGNOSIS — M25562 Pain in left knee: Secondary | ICD-10-CM | POA: Diagnosis not present

## 2022-04-13 DIAGNOSIS — M25561 Pain in right knee: Secondary | ICD-10-CM | POA: Diagnosis not present

## 2022-04-13 DIAGNOSIS — M1712 Unilateral primary osteoarthritis, left knee: Secondary | ICD-10-CM | POA: Diagnosis not present

## 2022-04-13 NOTE — Progress Notes (Signed)
Office Visit Note   Patient: Cynthia Parrish           Date of Birth: 20-Apr-1986           MRN: [id number] Visit Date: 04/13/2022 Requested by: Curlene Labrum, MD [place],  [place] [id number] PCP: Curlene Labrum, MD  Subjective: Chief Complaint  Patient presents with   Left Knee - Pain   Right Knee - Pain    HPI: Cynthia Parrish is a 36 year old patient with bilateral knee pain left worse than right.  Had left knee injection several months ago which helped for about 2 weeks.  Reports stiffness and swelling in the knee.  The injection gave her very good pain relief for 2 weeks but now her symptoms have recurred.  She states she is having a few issues with her hips as well.  She says her father has a history of arthritis.  She does report pain in other joints but not too much in the way of swelling or synovitis symptoms.  Over-the-counter medications are not particularly helpful for the knee.  Symptoms ongoing particularly in the left knee for longer than 6 months.              ROS: All systems reviewed are negative as they relate to the chief complaint within the history of present illness.  Patient denies  fevers or chills.   Assessment & Plan: Visit Diagnoses:  1. Pain in both knees, unspecified chronicity   2. Unilateral primary osteoarthritis, left knee     Plan: Impression is left knee pain with normal radiographs and a 36 year old patient who has failed conservative management including injections over-the-counter medications a home exercise program strengthening which includes walking.  She does have some focal joint line tenderness today indicating possible meniscal pathology.  Ligaments do feel stable.  Plan MRI scan and follow-up after that study.  Follow-Up Instructions: Return for after MRI.   Orders:  Orders Placed This Encounter  Procedures   MR Knee Left w/o contrast   No orders of the defined types were placed in this encounter.     Procedures: No  procedures performed   Clinical Data: No additional findings.  Objective: Vital Signs: There were no vitals taken for this visit.  Physical Exam:   Constitutional: Patient appears well-developed HEENT:  Head: Normocephalic Eyes:EOM are normal Neck: Normal range of motion Cardiovascular: Normal rate Pulmonary/chest: Effort normal Neurologic: Patient is alert Skin: Skin is warm Psychiatric: Patient has normal mood and affect   Ortho Exam: Ortho exam demonstrates normal gait and alignment.  Range of motion to the left knee is full.  Extensor mechanism functional and intact.  No warmth to either knee.  Collateral crucial ligaments are stable patient does have medial greater than lateral joint line tenderness.  She does have a known osteochondroma in the proximal tibial region but that is nontender to palpation.  No Baker's cyst.  Pedal pulses palpable.  Ankle dorsiflexion intact.  Specialty Comments:  No specialty comments available.  Imaging: No results found.   PMFS History: Patient Active Problem List   Diagnosis Date Noted   Papanicolaou smear of cervix with positive high risk human papilloma virus (HPV) test 12/07/2021   Headache associated with hormonal factors 12/01/2021   Encounter for gynecological examination with Papanicolaou smear of cervix 12/01/2021   Encounter for surveillance of vaginal ring hormonal contraceptive device 10/10/2020   Encounter for well woman exam with routine gynecological exam 10/10/2020  Menorrhagia with regular cycle 07/20/2016   Cyst of bone    Chondromalacia of both patellae 04/15/2014   Well woman exam with routine gynecological exam 08/12/2013   Past Medical History:  Diagnosis Date   Anxiety    GERD (gastroesophageal reflux disease)    Headache    Papanicolaou smear of cervix with positive high risk human papilloma virus (HPV) test 12/07/2021   12/07/21 repeat in 1 year per ASCCP guideline  5 year risk for CIN 3+ is 2.25.%     Family History  Problem Relation Age of Onset   Cancer Paternal Grandmother        breast    Hypertension Father    Diabetes Father    Arthritis Father    COPD Father    Congestive Heart Failure Father    Heart attack Father    Other Mother        open heart surgery   Cancer Brother        testicular     Past Surgical History:  Procedure Laterality Date   MASS EXCISION Left 04/01/2015   Procedure: EXCISION BONE SPUR  LEFT INDEX FINGER;  Surgeon: Carole Civil, MD;  Location: [facility];  Service: Orthopedics;  Laterality: Left;   TONSILLECTOMY     Social History   Occupational History   Not on file  Tobacco Use   Smoking status: Former    Packs/day: 0.50    Years: 11.00    Pack years: 5.50    Types: Cigarettes    Quit date: 06/19/2020    Years since quitting: 1.8   Smokeless tobacco: Never  Vaping Use   Vaping Use: Never used  Substance and Sexual Activity   Alcohol use: Yes    Comment: socially   Drug use: No   Sexual activity: Not Currently    Birth control/protection: Inserts

## 2022-05-09 ENCOUNTER — Encounter: Payer: Self-pay | Admitting: *Deleted

## 2022-08-23 DIAGNOSIS — Z6834 Body mass index (BMI) 34.0-34.9, adult: Secondary | ICD-10-CM | POA: Diagnosis not present

## 2022-08-23 DIAGNOSIS — D1622 Benign neoplasm of long bones of left lower limb: Secondary | ICD-10-CM | POA: Diagnosis not present

## 2022-08-23 DIAGNOSIS — M23304 Other meniscus derangements, unspecified medial meniscus, left knee: Secondary | ICD-10-CM | POA: Diagnosis not present

## 2022-08-23 DIAGNOSIS — F17210 Nicotine dependence, cigarettes, uncomplicated: Secondary | ICD-10-CM | POA: Diagnosis not present

## 2022-08-23 DIAGNOSIS — R030 Elevated blood-pressure reading, without diagnosis of hypertension: Secondary | ICD-10-CM | POA: Diagnosis not present

## 2022-08-27 ENCOUNTER — Other Ambulatory Visit (HOSPITAL_COMMUNITY): Payer: Self-pay | Admitting: Family Medicine

## 2022-08-27 DIAGNOSIS — M23304 Other meniscus derangements, unspecified medial meniscus, left knee: Secondary | ICD-10-CM

## 2022-08-28 ENCOUNTER — Ambulatory Visit (HOSPITAL_COMMUNITY)
Admission: RE | Admit: 2022-08-28 | Discharge: 2022-08-28 | Disposition: A | Discharge status: Home / Self Care | Payer: 59 | Source: Ambulatory Visit | Attending: Family Medicine | Admitting: Family Medicine

## 2022-08-28 DIAGNOSIS — M23304 Other meniscus derangements, unspecified medial meniscus, left knee: Secondary | ICD-10-CM | POA: Diagnosis not present

## 2022-08-28 DIAGNOSIS — M25562 Pain in left knee: Secondary | ICD-10-CM | POA: Diagnosis not present

## 2022-09-13 ENCOUNTER — Telehealth: Payer: 59 | Admitting: Emergency Medicine

## 2022-09-13 DIAGNOSIS — B9789 Other viral agents as the cause of diseases classified elsewhere: Secondary | ICD-10-CM | POA: Diagnosis not present

## 2022-09-13 DIAGNOSIS — J019 Acute sinusitis, unspecified: Secondary | ICD-10-CM | POA: Diagnosis not present

## 2022-09-13 MED ORDER — IPRATROPIUM BROMIDE 0.03 % NA SOLN: 2.0000 | Freq: Two times a day (BID) | NASAL | 0 refills | Status: DC

## 2022-09-13 NOTE — Progress Notes (Signed)
E-Visit for Sinus Problems  We are sorry that you are not feeling well.  Here is how we plan to help!  Based on what you have shared with me it looks like you have sinusitis.  Sinusitis is inflammation and infection in the sinus cavities of the head.  Based on your presentation I believe you most likely have Acute Viral Sinusitis.This is an infection most likely caused by a virus. There is not specific treatment for viral sinusitis other than to help you with the symptoms until the infection runs its course.    Antibiotics are not recommended by the Infectious Disease Society of [place] unless you have severe symptoms (including high fever) or you have symptoms for more than 10 days. If you still have symptoms after 10 days, antibiotics should be considered.    You may use an oral decongestant such as Mucinex D or if you have glaucoma or high blood pressure use plain Mucinex.   Saline nasal spray help and can safely be used as often as needed for congestion. Or try using saline irrigation, such as with a neti pot, several times a day while you are sick. Many neti pots come with salt packets premeasured to use to make saline. If you use your own salt, make sure it is kosher salt or sea salt (don't use table salt as it has iodine in it and you don't need that in your nose). Use distilled water to make saline. If you mix your own saline using your own salt, the recipe is 1/4 teaspoon salt in 1 cup warm water. Using saline irrigation can help prevent and treat sinus infections.   I have prescribed: Ipratropium Bromide nasal spray 0.03% 2 sprays in eah nostril 2-3 times a day  Some authorities believe that zinc sprays or the use of Echinacea may shorten the course of your symptoms.  Sinus infections are not as easily transmitted as other respiratory infection, however we still recommend that you avoid close contact with loved ones, especially the very young and elderly.  Remember to wash your hands  thoroughly throughout the day as this is the number one way to prevent the spread of infection!  Home Care: Only take medications as instructed by your medical team. Do not take these medications with alcohol. A steam or ultrasonic humidifier can help congestion.  You can place a towel over your head and breathe in the steam from hot water coming from a faucet. Avoid close contacts especially the very young and the elderly. Cover your mouth when you cough or sneeze. Always remember to wash your hands.  Get Help Right Away If: You develop worsening fever or sinus pain. You develop a severe head ache or visual changes. Your symptoms persist after you have completed your treatment plan.  Make sure you Understand these instructions. Will watch your condition. Will get help right away if you are not doing well or get worse.   Thank you for choosing an e-visit.  Your e-visit answers were reviewed by a board certified advanced clinical practitioner to complete your personal care plan. Depending upon the condition, your plan could have included both over the counter or prescription medications.  Please review your pharmacy choice. Make sure the pharmacy is open so you can pick up prescription now. If there is a problem, you may contact your provider through [employer] and have the prescription routed to another pharmacy.  Your safety is important to [place]. If you have drug allergies check your  prescription carefully.   For the next 24 hours you can use MyChart to ask questions about today's visit, request a non-urgent call back, or ask for a work or school excuse. You will get an email in the next two days asking about your experience. I hope that your e-visit has been valuable and will speed your recovery.  I have spent 5 minutes in review of e-visit questionnaire, review and updating patient chart, medical decision making and response to patient.   Willeen Cass, PhD, FNP-BC

## 2022-09-14 ENCOUNTER — Telehealth: Payer: 59 | Admitting: Physician Assistant

## 2022-09-14 DIAGNOSIS — B9789 Other viral agents as the cause of diseases classified elsewhere: Secondary | ICD-10-CM

## 2022-09-14 MED ORDER — PREDNISONE 20 MG PO TABS: 40.0000 mg | ORAL_TABLET | Freq: Every day | ORAL | 0 refills | Status: DC

## 2022-09-14 NOTE — Progress Notes (Signed)
I have spent 5 minutes in review of e-visit questionnaire, review and updating patient chart, medical decision making and response to patient.   Ashantee Deupree Cody Alaysha Jefcoat, PA-C    

## 2022-09-14 NOTE — Progress Notes (Signed)
E-Visit for Sinus Problems  We are sorry that you are not feeling well.  Here is how we plan to help!  Based on what you have shared with me it looks like you have sinusitis.  Sinusitis is inflammation and infection in the sinus cavities of the head.  Based on your presentation I believe you most likely have Acute Viral Sinusitis.This is an infection most likely caused by a virus. There is not specific treatment for viral sinusitis other than to help you with the symptoms until the infection runs its course.  You may use an oral decongestant such as Mucinex D or if you have glaucoma or high blood pressure use plain Mucinex. Saline nasal spray help and can safely be used as often as needed for congestion,   I will add-on a burst of prednisone (steroid) to help reduce sinus inflammation and congestion while your body is fighting this off.   Some authorities believe that zinc sprays or the use of Echinacea may shorten the course of your symptoms.  Sinus infections are not as easily transmitted as other respiratory infection, however we still recommend that you avoid close contact with loved ones, especially the very young and elderly.  Remember to wash your hands thoroughly throughout the day as this is the number one way to prevent the spread of infection!  Home Care: Only take medications as instructed by your medical team. Do not take these medications with alcohol. A steam or ultrasonic humidifier can help congestion.  You can place a towel over your head and breathe in the steam from hot water coming from a faucet. Avoid close contacts especially the very young and the elderly. Cover your mouth when you cough or sneeze. Always remember to wash your hands.  Get Help Right Away If: You develop worsening fever or sinus pain. You develop a severe head ache or visual changes. Your symptoms persist after you have completed your treatment plan.  Make sure you Understand these instructions. Will  watch your condition. Will get help right away if you are not doing well or get worse.   Thank you for choosing an e-visit.  Your e-visit answers were reviewed by a board certified advanced clinical practitioner to complete your personal care plan. Depending upon the condition, your plan could have included both over the counter or prescription medications.  Please review your pharmacy choice. Make sure the pharmacy is open so you can pick up prescription now. If there is a problem, you may contact your provider through [employer] and have the prescription routed to another pharmacy.  Your safety is important to [place]. If you have drug allergies check your prescription carefully.   For the next 24 hours you can use MyChart to ask questions about today's visit, request a non-urgent call back, or ask for a work or school excuse. You will get an email in the next two days asking about your experience. I hope that your e-visit has been valuable and will speed your recovery.

## 2022-09-17 ENCOUNTER — Ambulatory Visit (HOSPITAL_COMMUNITY): Admission: RE | Admit: 2022-09-17 | Payer: 59 | Source: Ambulatory Visit

## 2022-09-20 ENCOUNTER — Telehealth: Payer: 59 | Admitting: Physician Assistant

## 2022-09-20 DIAGNOSIS — B9689 Other specified bacterial agents as the cause of diseases classified elsewhere: Secondary | ICD-10-CM

## 2022-09-20 MED ORDER — DOXYCYCLINE HYCLATE 100 MG PO TABS: 100.0000 mg | ORAL_TABLET | Freq: Two times a day (BID) | ORAL | 0 refills | Status: DC

## 2022-09-20 MED ORDER — BENZONATATE 100 MG PO CAPS: 100.0000 mg | ORAL_CAPSULE | Freq: Three times a day (TID) | ORAL | 0 refills | Status: DC | PRN

## 2022-09-20 NOTE — Progress Notes (Signed)
We are sorry that you are not feeling well.  Here is how we plan to help!  Based on your presentation I believe you most likely are still dealing with a viral bronchitis/sinusitis, thankfully improved a bit with the use of prednisone. I want you to do a few things -- I would continue use of the nasal spray and finish course of steroid. Switch the Mucinex-DM to a Mucinex sinus-- I can send as a prescription to make cheaper -- to better dry up any congestion that is not coming up and out. I am adding on a prescription cough medication to get a better control of cough. If not turning the corner over the next few days, I have sent an antibiotic to be placed on file at the pharmacy to take as directed.     From your responses in the eVisit questionnaire you describe inflammation in the upper respiratory tract which is causing a significant cough.  This is commonly called Bronchitis and has four common causes:   Allergies Viral Infections Acid Reflux Bacterial Infection Allergies, viruses and acid reflux are treated by controlling symptoms or eliminating the cause. An example might be a cough caused by taking certain blood pressure medications. You stop the cough by changing the medication. Another example might be a cough caused by acid reflux. Controlling the reflux helps control the cough.  USE OF BRONCHODILATOR ("RESCUE") INHALERS: There is a risk from using your bronchodilator too frequently.  The risk is that over-reliance on a medication which only relaxes the muscles surrounding the breathing tubes can reduce the effectiveness of medications prescribed to reduce swelling and congestion of the tubes themselves.  Although you feel brief relief from the bronchodilator inhaler, your asthma may actually be worsening with the tubes becoming more swollen and filled with mucus.  This can delay other crucial treatments, such as oral steroid medications. If you need to use a bronchodilator inhaler daily,  several times per day, you should discuss this with your provider.  There are probably better treatments that could be used to keep your asthma under control.     HOME CARE Only take medications as instructed by your medical team. Complete the entire course of an antibiotic. Drink plenty of fluids and get plenty of rest. Avoid close contacts especially the very young and the elderly Cover your mouth if you cough or cough into your sleeve. Always remember to wash your hands A steam or ultrasonic humidifier can help congestion.   GET HELP RIGHT AWAY IF: You develop worsening fever. You become short of breath You cough up blood. Your symptoms persist after you have completed your treatment plan MAKE SURE YOU  Understand these instructions. Will watch your condition. Will get help right away if you are not doing well or get worse.    Thank you for choosing an e-visit.  Your e-visit answers were reviewed by a board certified advanced clinical practitioner to complete your personal care plan. Depending upon the condition, your plan could have included both over the counter or prescription medications.  Please review your pharmacy choice. Make sure the pharmacy is open so you can pick up prescription now. If there is a problem, you may contact your provider through [employer] and have the prescription routed to another pharmacy.  Your safety is important to [place]. If you have drug allergies check your prescription carefully.   For the next 24 hours you can use MyChart to ask questions about today's visit, request a non-urgent  call back, or ask for a work or school excuse. You will get an email in the next two days asking about your experience. I hope that your e-visit has been valuable and will speed your recovery.

## 2022-09-20 NOTE — Progress Notes (Signed)
I have spent 5 minutes in review of e-visit questionnaire, review and updating patient chart, medical decision making and response to patient.   Francis Doenges Cody Cherity Blickenstaff, PA-C    

## 2022-12-11 ENCOUNTER — Ambulatory Visit: Payer: Commercial Managed Care - PPO | Admitting: Adult Health

## 2022-12-14 ENCOUNTER — Ambulatory Visit: Payer: 59 | Admitting: Adult Health

## 2022-12-14 ENCOUNTER — Other Ambulatory Visit (HOSPITAL_COMMUNITY)
Admission: RE | Admit: 2022-12-14 | Discharge: 2022-12-14 | Disposition: A | Discharge status: Home / Self Care | Payer: 59 | Source: Ambulatory Visit | Attending: Adult Health | Admitting: Adult Health

## 2022-12-14 ENCOUNTER — Encounter: Payer: Self-pay | Admitting: Adult Health

## 2022-12-14 VITALS — BP 124/82 | HR 81 | Ht 64.0 in | Wt 217.5 lb

## 2022-12-14 DIAGNOSIS — Z3044 Encounter for surveillance of vaginal ring hormonal contraceptive device: Secondary | ICD-10-CM

## 2022-12-14 DIAGNOSIS — Z01419 Encounter for gynecological examination (general) (routine) without abnormal findings: Secondary | ICD-10-CM

## 2022-12-14 DIAGNOSIS — G4489 Other headache syndrome: Secondary | ICD-10-CM | POA: Diagnosis not present

## 2022-12-14 DIAGNOSIS — R87810 Cervical high risk human papillomavirus (HPV) DNA test positive: Secondary | ICD-10-CM | POA: Diagnosis not present

## 2022-12-14 MED ORDER — ETONOGESTREL-ETHINYL ESTRADIOL 0.12-0.015 MG/24HR VA RING
1.0000 | VAGINAL_RING | VAGINAL | 4 refills | Status: DC
  Filled 2023-05-22: qty 3, 84d supply, fill #0
  Filled 2023-07-29: qty 3, 63d supply, fill #1
  Filled 2023-10-14: qty 3, 63d supply, fill #2

## 2022-12-14 MED ORDER — BUTALBITAL-APAP-CAFFEINE 50-325-40 MG PO TABS: 1.0000 | ORAL_TABLET | Freq: Four times a day (QID) | ORAL | 0 refills | Status: DC | PRN

## 2022-12-14 NOTE — Progress Notes (Signed)
Patient ID: Cynthia Parrish, female   DOB: Oct 20, 1986, 37 y.o.   MRN: [id number] History of Present Illness: Cynthia Parrish is a 37 year old white female, single, G1P0101, in for a well woman gyn exam and pap. Headaches are better, she got glasses and Fioricet helps. Happy with nuvra ring. She is working from home.  PCP is Dr Pleas Koch.   Current Medications, Allergies, Past Medical History, Past Surgical History, Family History and Social History were reviewed in [employer] record.     Review of Systems: Patient denies any daily headaches, hearing loss, fatigue, blurred vision, shortness of breath, chest pain, abdominal pain, problems with bowel movements, urination, or intercourse. No joint pain or mood swings.  See HPI for positives.   Physical Exam:BP 124/82 (BP Location: Right Arm, Patient Position: Sitting, Cuff Size: Normal)   Pulse 81   Ht '5\' 4"'$  (1.626 m)   Wt 217 lb 8 oz (98.7 kg)   LMP 11/26/2022 (Approximate)   BMI 37.33 kg/m   General:  Well developed, well nourished, no acute distress Skin:  Warm and dry Neck:  Midline trachea, normal thyroid, good ROM, no lymphadenopathy Lungs; Clear to auscultation bilaterally Breast:  No dominant palpable mass, retraction, or nipple discharge Cardiovascular: Regular rate and rhythm Abdomen:  Soft, non tender, no hepatosplenomegaly Pelvic:  External genitalia is normal in appearance, no lesions.  The vagina is normal in appearance,ring in place. Urethra has no lesions or masses. The cervix is bulbous. Pap with HR HPV genotyping performed. Uterus is felt to be normal size, shape, and contour.  No adnexal masses or tenderness noted.Bladder is non tender, no masses felt. Extremities/musculoskeletal:  No swelling or varicosities noted, no clubbing or cyanosis Psych:  No mood changes, alert and cooperative,seems happy AA is 1 Fall risk is low    12/14/2022   10:46 AM 12/01/2021   11:42 AM 10/10/2020   11:48 AM  Depression  screen PHQ 2/9  Decreased Interest 0 0 0  Down, Depressed, Hopeless 0 1 0  PHQ - 2 Score 0 1 0  Altered sleeping '2 2 2  '$ Tired, decreased energy '2 2 2  '$ Change in appetite 0 0 0  Feeling bad or failure about yourself  0 0 0  Trouble concentrating 0 0 0  Moving slowly or fidgety/restless 0 0 0  Suicidal thoughts 0 0 0  PHQ-9 Score '4 5 4       '$ 12/14/2022   10:46 AM 12/01/2021   11:42 AM 10/10/2020   11:48 AM  GAD 7 : Generalized Anxiety Score  Nervous, Anxious, on Edge 2 1 0  Control/stop worrying 1 1 0  Worry too much - different things 1 1 0  Trouble relaxing 2 1 0  Restless 0 0 0  Easily annoyed or irritable 2 0 1  Afraid - awful might happen 0 0 0  Total GAD 7 Score '8 4 1      '$ Upstream - 12/14/22 1044       Pregnancy Intention Screening   Does the patient want to become pregnant in the next year? No    Does the patient's partner want to become pregnant in the next year? No    Would the patient like to discuss contraceptive options today? No      Contraception Wrap Up   Current Method Abstinence;Vaginal Ring    End Method Abstinence;Vaginal Ring    Contraception Counseling Provided No  Examination chaperoned by Levy Pupa LPN  Impression and Plan: 1. Encounter for gynecological examination with Papanicolaou smear of cervix Pap sent Pap in 3 years if normal Physical in 1 year Labs with PCP  2. Encounter for surveillance of vaginal ring hormonal contraceptive device Happy with ring  Meds ordered this encounter  Medications   etonogestrel-ethinyl estradiol (NUVARING) 0.12-0.015 MG/24HR vaginal ring    Sig: Place 1 each vaginally every 21 ( twenty-one) days. Insert vaginally and leave in place for 3 consecutive weeks, then replace with new ring. ( continuous regimen)    Dispense:  3 each    Refill:  4    Please consider 90 day supplies to promote better adherence    Order Specific Question:   Supervising Provider    Answer:   Florian Buff  [[id number]]   butalbital-acetaminophen-caffeine (FIORICET) 50-325-40 MG tablet    Sig: Take 1-2 tablets by mouth every 6 (six) hours as needed for headache.    Dispense:  20 tablet    Refill:  0    Order Specific Question:   Supervising Provider    Answer:   Elonda Husky, LUTHER H [[id number]]     3. Headache associated with hormonal factors Periods better Will refill Fioricet  4. Papanicolaou smear of cervix with positive high risk human papilloma virus (HPV) test Pap sent

## 2022-12-19 LAB — CYTOLOGY - PAP
Comment: NEGATIVE
Diagnosis: UNDETERMINED — AB
High risk HPV: NEGATIVE

## 2022-12-20 ENCOUNTER — Encounter: Payer: Self-pay | Admitting: Adult Health

## 2022-12-20 DIAGNOSIS — R87610 Atypical squamous cells of undetermined significance on cytologic smear of cervix (ASC-US): Secondary | ICD-10-CM | POA: Insufficient documentation

## 2023-01-04 DIAGNOSIS — M79604 Pain in right leg: Secondary | ICD-10-CM | POA: Diagnosis not present

## 2023-01-04 DIAGNOSIS — M25562 Pain in left knee: Secondary | ICD-10-CM | POA: Diagnosis not present

## 2023-01-04 DIAGNOSIS — M25561 Pain in right knee: Secondary | ICD-10-CM | POA: Diagnosis not present

## 2023-01-24 DIAGNOSIS — M25562 Pain in left knee: Secondary | ICD-10-CM | POA: Diagnosis not present

## 2023-02-01 ENCOUNTER — Telehealth: Payer: 59 | Admitting: Family Medicine

## 2023-02-01 DIAGNOSIS — M545 Low back pain, unspecified: Secondary | ICD-10-CM

## 2023-02-01 MED ORDER — CYCLOBENZAPRINE HCL 10 MG PO TABS: 10.0000 mg | ORAL_TABLET | Freq: Three times a day (TID) | ORAL | 0 refills | Status: DC | PRN

## 2023-02-01 MED ORDER — NAPROXEN 500 MG PO TABS: 500.0000 mg | ORAL_TABLET | Freq: Two times a day (BID) | ORAL | 0 refills | Status: DC

## 2023-02-01 NOTE — Progress Notes (Signed)

## 2023-04-01 DIAGNOSIS — M222X2 Patellofemoral disorders, left knee: Secondary | ICD-10-CM | POA: Diagnosis not present

## 2023-04-01 DIAGNOSIS — M222X1 Patellofemoral disorders, right knee: Secondary | ICD-10-CM | POA: Diagnosis not present

## 2023-04-01 DIAGNOSIS — M659 Synovitis and tenosynovitis, unspecified: Secondary | ICD-10-CM | POA: Diagnosis not present

## 2023-04-01 DIAGNOSIS — M6751 Plica syndrome, right knee: Secondary | ICD-10-CM | POA: Diagnosis not present

## 2023-04-01 DIAGNOSIS — G8918 Other acute postprocedural pain: Secondary | ICD-10-CM | POA: Diagnosis not present

## 2023-04-01 DIAGNOSIS — M6752 Plica syndrome, left knee: Secondary | ICD-10-CM | POA: Diagnosis not present

## 2023-04-01 DIAGNOSIS — M794 Hypertrophy of (infrapatellar) fat pad: Secondary | ICD-10-CM | POA: Diagnosis not present

## 2023-04-08 DIAGNOSIS — M25562 Pain in left knee: Secondary | ICD-10-CM | POA: Diagnosis not present

## 2023-04-20 HISTORY — PX: OTHER SURGICAL HISTORY: SHX169

## 2023-04-24 DIAGNOSIS — M25562 Pain in left knee: Secondary | ICD-10-CM | POA: Diagnosis not present

## 2023-05-22 ENCOUNTER — Other Ambulatory Visit (HOSPITAL_COMMUNITY): Payer: Self-pay

## 2023-05-22 ENCOUNTER — Other Ambulatory Visit (HOSPITAL_BASED_OUTPATIENT_CLINIC_OR_DEPARTMENT_OTHER): Payer: Self-pay

## 2023-05-24 ENCOUNTER — Other Ambulatory Visit (HOSPITAL_BASED_OUTPATIENT_CLINIC_OR_DEPARTMENT_OTHER): Payer: Self-pay

## 2023-06-11 DIAGNOSIS — Z720 Tobacco use: Secondary | ICD-10-CM | POA: Diagnosis not present

## 2023-06-11 DIAGNOSIS — R030 Elevated blood-pressure reading, without diagnosis of hypertension: Secondary | ICD-10-CM | POA: Diagnosis not present

## 2023-06-11 DIAGNOSIS — Z6836 Body mass index (BMI) 36.0-36.9, adult: Secondary | ICD-10-CM | POA: Diagnosis not present

## 2023-06-11 DIAGNOSIS — G4700 Insomnia, unspecified: Secondary | ICD-10-CM | POA: Diagnosis not present

## 2023-06-11 DIAGNOSIS — F411 Generalized anxiety disorder: Secondary | ICD-10-CM | POA: Diagnosis not present

## 2023-07-29 ENCOUNTER — Other Ambulatory Visit (HOSPITAL_BASED_OUTPATIENT_CLINIC_OR_DEPARTMENT_OTHER): Payer: Self-pay

## 2023-08-06 ENCOUNTER — Other Ambulatory Visit (HOSPITAL_BASED_OUTPATIENT_CLINIC_OR_DEPARTMENT_OTHER): Payer: Self-pay

## 2023-08-06 MED ORDER — CLONAZEPAM 0.5 MG PO TABS
0.5000 mg | ORAL_TABLET | Freq: Every day | ORAL | 0 refills | Status: AC
  Filled 2023-08-06: qty 30, 30d supply, fill #0

## 2023-08-13 ENCOUNTER — Other Ambulatory Visit: Payer: Self-pay

## 2023-08-13 ENCOUNTER — Other Ambulatory Visit (HOSPITAL_BASED_OUTPATIENT_CLINIC_OR_DEPARTMENT_OTHER): Payer: Self-pay

## 2023-08-13 ENCOUNTER — Other Ambulatory Visit: Payer: Self-pay | Admitting: Adult Health

## 2023-08-13 MED ORDER — BUTALBITAL-APAP-CAFFEINE 50-325-40 MG PO TABS
1.0000 | ORAL_TABLET | Freq: Four times a day (QID) | ORAL | 0 refills | Status: DC | PRN
  Filled 2023-08-13: qty 20, 5d supply, fill #0

## 2023-08-13 NOTE — Telephone Encounter (Signed)
Refill sent for Fioricet

## 2023-08-14 ENCOUNTER — Other Ambulatory Visit (HOSPITAL_BASED_OUTPATIENT_CLINIC_OR_DEPARTMENT_OTHER): Payer: Self-pay

## 2023-09-12 ENCOUNTER — Other Ambulatory Visit (HOSPITAL_BASED_OUTPATIENT_CLINIC_OR_DEPARTMENT_OTHER): Payer: Self-pay

## 2023-09-12 ENCOUNTER — Telehealth: Payer: 59 | Admitting: Physician Assistant

## 2023-09-12 DIAGNOSIS — J208 Acute bronchitis due to other specified organisms: Secondary | ICD-10-CM

## 2023-09-12 MED ORDER — ALBUTEROL SULFATE HFA 108 (90 BASE) MCG/ACT IN AERS
2.0000 | INHALATION_SPRAY | Freq: Four times a day (QID) | RESPIRATORY_TRACT | 0 refills | Status: DC | PRN
  Filled 2023-09-12: qty 6.7, 15d supply, fill #0

## 2023-09-12 MED ORDER — BENZONATATE 100 MG PO CAPS
100.0000 mg | ORAL_CAPSULE | Freq: Three times a day (TID) | ORAL | 0 refills | Status: DC | PRN
  Filled 2023-09-12: qty 30, 10d supply, fill #0

## 2023-09-12 MED ORDER — PREDNISONE 20 MG PO TABS
40.0000 mg | ORAL_TABLET | Freq: Every day | ORAL | 0 refills | Status: DC
  Filled 2023-09-12: qty 10, 5d supply, fill #0

## 2023-09-12 NOTE — Progress Notes (Signed)
E-Visit for Cough   We are sorry that you are not feeling well.  Here is how we plan to help!  Based on your presentation I believe you most likely have A cough due to a virus.  This is called viral bronchitis and is best treated by rest, plenty of fluids and control of the cough.  You may use Ibuprofen or Tylenol as directed to help your symptoms.     In addition you may use A prescription cough medication called Tessalon Perles 100mg . You may take 1-2 capsules every 8 hours as needed for your cough. I have also sent in a prescription albuterol inhaler to relax airways and a 5-day burst of prednisone.   From your responses in the eVisit questionnaire you describe inflammation in the upper respiratory tract which is causing a significant cough.  This is commonly called Bronchitis and has four common causes:   Allergies Viral Infections Acid Reflux Bacterial Infection Allergies, viruses and acid reflux are treated by controlling symptoms or eliminating the cause. An example might be a cough caused by taking certain blood pressure medications. You stop the cough by changing the medication. Another example might be a cough caused by acid reflux. Controlling the reflux helps control the cough.  USE OF BRONCHODILATOR ("RESCUE") INHALERS: There is a risk from using your bronchodilator too frequently.  The risk is that over-reliance on a medication which only relaxes the muscles surrounding the breathing tubes can reduce the effectiveness of medications prescribed to reduce swelling and congestion of the tubes themselves.  Although you feel brief relief from the bronchodilator inhaler, your asthma may actually be worsening with the tubes becoming more swollen and filled with mucus.  This can delay other crucial treatments, such as oral steroid medications. If you need to use a bronchodilator inhaler daily, several times per day, you should discuss this with your provider.  There are probably better  treatments that could be used to keep your asthma under control.     HOME CARE Only take medications as instructed by your medical team. Complete the entire course of an antibiotic. Drink plenty of fluids and get plenty of rest. Avoid close contacts especially the very young and the elderly Cover your mouth if you cough or cough into your sleeve. Always remember to wash your hands A steam or ultrasonic humidifier can help congestion.   GET HELP RIGHT AWAY IF: You develop worsening fever. You become short of breath You cough up blood. Your symptoms persist after you have completed your treatment plan MAKE SURE YOU  Understand these instructions. Will watch your condition. Will get help right away if you are not doing well or get worse.    Thank you for choosing an e-visit.  Your e-visit answers were reviewed by a board certified advanced clinical practitioner to complete your personal care plan. Depending upon the condition, your plan could have included both over the counter or prescription medications.  Please review your pharmacy choice. Make sure the pharmacy is open so you can pick up prescription now. If there is a problem, you may contact your provider through [employer] and have the prescription routed to another pharmacy.  Your safety is important to [place]. If you have drug allergies check your prescription carefully.   For the next 24 hours you can use MyChart to ask questions about today's visit, request a non-urgent call back, or ask for a work or school excuse. You will get an email in the next two  days asking about your experience. I hope that your e-visit has been valuable and will speed your recovery.

## 2023-09-12 NOTE — Progress Notes (Signed)
I have spent 5 minutes in review of e-visit questionnaire, review and updating patient chart, medical decision making and response to patient.   Mia Milan Cody Jacklynn Dehaas, PA-C    

## 2023-10-14 ENCOUNTER — Other Ambulatory Visit (HOSPITAL_BASED_OUTPATIENT_CLINIC_OR_DEPARTMENT_OTHER): Payer: Self-pay

## 2023-10-15 ENCOUNTER — Other Ambulatory Visit (HOSPITAL_BASED_OUTPATIENT_CLINIC_OR_DEPARTMENT_OTHER): Payer: Self-pay

## 2023-10-22 ENCOUNTER — Other Ambulatory Visit (HOSPITAL_BASED_OUTPATIENT_CLINIC_OR_DEPARTMENT_OTHER): Payer: Self-pay

## 2023-10-22 MED ORDER — HYDROXYZINE HCL 25 MG PO TABS
25.0000 mg | ORAL_TABLET | Freq: Every day | ORAL | 11 refills | Status: AC
Start: 2023-06-11 — End: ?
  Filled 2023-10-22: qty 60, 30d supply, fill #0
  Filled 2024-01-03: qty 60, 30d supply, fill #1
  Filled 2024-03-19: qty 60, 30d supply, fill #2

## 2023-10-24 DIAGNOSIS — Z9889 Other specified postprocedural states: Secondary | ICD-10-CM | POA: Diagnosis not present

## 2023-10-24 DIAGNOSIS — M25562 Pain in left knee: Secondary | ICD-10-CM | POA: Diagnosis not present

## 2023-10-25 ENCOUNTER — Other Ambulatory Visit (HOSPITAL_BASED_OUTPATIENT_CLINIC_OR_DEPARTMENT_OTHER): Payer: Self-pay

## 2023-10-25 DIAGNOSIS — H524 Presbyopia: Secondary | ICD-10-CM | POA: Diagnosis not present

## 2023-10-25 MED ORDER — CYCLOSPORINE 0.05 % OP EMUL
1.0000 [drp] | Freq: Two times a day (BID) | OPHTHALMIC | 6 refills | Status: AC
  Filled 2023-10-25: qty 60, 30d supply, fill #0

## 2023-12-27 ENCOUNTER — Other Ambulatory Visit: Payer: Self-pay | Admitting: Adult Health

## 2023-12-27 DIAGNOSIS — Z13220 Encounter for screening for lipoid disorders: Secondary | ICD-10-CM

## 2023-12-27 DIAGNOSIS — Z01419 Encounter for gynecological examination (general) (routine) without abnormal findings: Secondary | ICD-10-CM

## 2023-12-27 NOTE — Progress Notes (Signed)
 Check CBC,CMP, and lipids

## 2024-01-01 DIAGNOSIS — Z01419 Encounter for gynecological examination (general) (routine) without abnormal findings: Secondary | ICD-10-CM | POA: Diagnosis not present

## 2024-01-01 DIAGNOSIS — Z13220 Encounter for screening for lipoid disorders: Secondary | ICD-10-CM | POA: Diagnosis not present

## 2024-01-02 LAB — CBC
Hematocrit: 43.3 % (ref 34.0–46.6)
Hemoglobin: 14.1 g/dL (ref 11.1–15.9)
MCH: 29.6 pg (ref 26.6–33.0)
MCHC: 32.6 g/dL (ref 31.5–35.7)
MCV: 91 fL (ref 79–97)
Platelets: 300 10*3/uL (ref 150–450)
RBC: 4.76 x10E6/uL (ref 3.77–5.28)
RDW: 11.8 % (ref 11.7–15.4)
WBC: 9.7 10*3/uL (ref 3.4–10.8)

## 2024-01-02 LAB — COMPREHENSIVE METABOLIC PANEL
ALT: 17 [IU]/L (ref 0–32)
AST: 17 [IU]/L (ref 0–40)
Albumin: 3.7 g/dL — ABNORMAL LOW (ref 3.9–4.9)
Alkaline Phosphatase: 90 [IU]/L (ref 44–121)
BUN/Creatinine Ratio: 12 (ref 9–23)
BUN: 12 mg/dL (ref 6–20)
Bilirubin Total: 0.2 mg/dL (ref 0.0–1.2)
CO2: 22 mmol/L (ref 20–29)
Calcium: 9.9 mg/dL (ref 8.7–10.2)
Chloride: 106 mmol/L (ref 96–106)
Creatinine, Ser: 0.98 mg/dL (ref 0.57–1.00)
Globulin, Total: 2.1 g/dL (ref 1.5–4.5)
Glucose: 87 mg/dL (ref 70–99)
Potassium: 4.5 mmol/L (ref 3.5–5.2)
Sodium: 141 mmol/L (ref 134–144)
Total Protein: 5.8 g/dL — ABNORMAL LOW (ref 6.0–8.5)
eGFR: 77 mL/min/{1.73_m2} (ref >59)

## 2024-01-02 LAB — LIPID PANEL
Chol/HDL Ratio: 4.1 {ratio} (ref 0.0–4.4)
Cholesterol, Total: 168 mg/dL (ref 100–199)
HDL: 41 mg/dL (ref >39)
LDL Chol Calc (NIH): 107 mg/dL — ABNORMAL HIGH (ref 0–99)
Triglycerides: 111 mg/dL (ref 0–149)
VLDL Cholesterol Cal: 20 mg/dL (ref 5–40)

## 2024-01-03 ENCOUNTER — Encounter: Payer: Self-pay | Admitting: Adult Health

## 2024-01-03 ENCOUNTER — Other Ambulatory Visit (HOSPITAL_COMMUNITY)
Admission: RE | Admit: 2024-01-03 | Discharge: 2024-01-03 | Disposition: A | Discharge status: Home / Self Care | Payer: Commercial Managed Care - PPO | Source: Ambulatory Visit | Attending: Adult Health | Admitting: Adult Health

## 2024-01-03 ENCOUNTER — Other Ambulatory Visit (HOSPITAL_BASED_OUTPATIENT_CLINIC_OR_DEPARTMENT_OTHER): Payer: Self-pay

## 2024-01-03 ENCOUNTER — Ambulatory Visit (INDEPENDENT_AMBULATORY_CARE_PROVIDER_SITE_OTHER): Payer: Commercial Managed Care - PPO | Admitting: Adult Health

## 2024-01-03 VITALS — BP 129/97 | HR 100 | Ht 64.0 in | Wt 224.0 lb

## 2024-01-03 DIAGNOSIS — Z01419 Encounter for gynecological examination (general) (routine) without abnormal findings: Secondary | ICD-10-CM

## 2024-01-03 DIAGNOSIS — R61 Generalized hyperhidrosis: Secondary | ICD-10-CM

## 2024-01-03 DIAGNOSIS — R87610 Atypical squamous cells of undetermined significance on cytologic smear of cervix (ASC-US): Secondary | ICD-10-CM

## 2024-01-03 DIAGNOSIS — L659 Nonscarring hair loss, unspecified: Secondary | ICD-10-CM

## 2024-01-03 DIAGNOSIS — R232 Flushing: Secondary | ICD-10-CM

## 2024-01-03 DIAGNOSIS — Z1331 Encounter for screening for depression: Secondary | ICD-10-CM

## 2024-01-03 DIAGNOSIS — Z3044 Encounter for surveillance of vaginal ring hormonal contraceptive device: Secondary | ICD-10-CM

## 2024-01-03 DIAGNOSIS — R030 Elevated blood-pressure reading, without diagnosis of hypertension: Secondary | ICD-10-CM | POA: Diagnosis not present

## 2024-01-03 MED ORDER — BUTALBITAL-APAP-CAFFEINE 50-325-40 MG PO TABS
1.0000 | ORAL_TABLET | Freq: Four times a day (QID) | ORAL | 0 refills | Status: DC | PRN
  Filled 2024-01-03: qty 20, 5d supply, fill #0

## 2024-01-03 MED ORDER — ETONOGESTREL-ETHINYL ESTRADIOL 0.12-0.015 MG/24HR VA RING
1.0000 | VAGINAL_RING | VAGINAL | 4 refills | Status: AC
  Filled 2024-01-03: qty 3, 63d supply, fill #0
  Filled 2024-05-12: qty 3, 63d supply, fill #1
  Filled 2024-07-30: qty 3, 63d supply, fill #2

## 2024-01-03 NOTE — Progress Notes (Signed)
Patient ID: Cynthia Parrish, female   DOB: 11/21/1985, 38 y.o.   MRN: [id number] History of Present Illness: Cynthia Parrish is a 38 year old white female,single, G1P0101, in for a well woman gyn exam and pap. Last pap was ASCUS negative HPV.  PCP is Dr Leandrew Koyanagi   Current Medications, Allergies, Past Medical History, Past Surgical History, Family History and Social History were reviewed in [employer] electronic medical record.     Review of Systems: Patient denies any daily headaches, but has headaches and Fioricet helps, hearing loss, fatigue, blurred vision, shortness of breath, chest pain, abdominal pain, problems with bowel movements, urination, or intercourse(not active). No joint pain or mood swings.  Having hot flashes and night sweats, not sleeping well Has mild hair thinning  Ears ring at times   Physical Exam:BP (!) 129/97 (BP Location: Left Arm, Patient Position: Sitting, Cuff Size: Normal)   Pulse 100   Ht 5\' 4"  (1.626 m)   Wt 224 lb (101.6 kg)   LMP 12/28/2023 (Exact Date)   BMI 38.45 kg/m   General:  Well developed, well nourished, no acute distress Skin:  Warm and dry Neck:  Midline trachea, normal thyroid, good ROM, no lymphadenopathy Lungs; Clear to auscultation bilaterally Breast:  No dominant palpable mass, retraction, or nipple discharge Cardiovascular: Regular rate and rhythm Abdomen:  Soft, non tender, no hepatosplenomegaly Pelvic:  External genitalia is normal in appearance, no lesions.  The vagina is normal in appearance,nuva ring in place. Urethra has no lesions or masses. The cervix is smooth, pap with HR HPV genotyping performed,bleed with EC brush. Uterus is felt to be normal size, shape, and contour.  No adnexal masses or tenderness noted.Bladder is non tender, no masses felt. Extremities/musculoskeletal:  No swelling or varicosities noted, no clubbing or cyanosis Psych:  No mood changes, alert and cooperative,seems happy AA is 1 Fall risk is low     01/03/2024    9:38 AM 12/14/2022   10:46 AM 12/01/2021   11:42 AM  Depression screen PHQ 2/9  Decreased Interest 0 0 0  Down, Depressed, Hopeless 0 0 1  PHQ - 2 Score 0 0 1  Altered sleeping 3 2 2   Tired, decreased energy 1 2 2   Change in appetite 0 0 0  Feeling bad or failure about yourself  0 0 0  Trouble concentrating 0 0 0  Moving slowly or fidgety/restless 1 0 0  Suicidal thoughts 0 0 0  PHQ-9 Score 5 4 5    Has vistaril and klonopin    01/03/2024    9:38 AM 12/14/2022   10:46 AM 12/01/2021   11:42 AM 10/10/2020   11:48 AM  GAD 7 : Generalized Anxiety Score  Nervous, Anxious, on Edge 1 2 1  0  Control/stop worrying 0 1 1 0  Worry too much - different things 1 1 1  0  Trouble relaxing 2 2 1  0  Restless 1 0 0 0  Easily annoyed or irritable 0 2 0 1  Afraid - awful might happen 1 0 0 0  Total GAD 7 Score 6 8 4 1       Upstream - 01/03/24 0944       Pregnancy Intention Screening   Does the patient want to become pregnant in the next year? No    Does the patient's partner want to become pregnant in the next year? No    Would the patient like to discuss contraceptive options today? No      Contraception Wrap Up  Current Method Abstinence;Vaginal Ring    End Method Abstinence;Vaginal Ring    Contraception Counseling Provided Yes             Examination chaperoned by Malachy Mood LPN  Impression and plan: 1. Encounter for gynecological examination with Papanicolaou smear of cervix (Primary) Pap sent Physical in 1 year Reviewed labs with her Refilled fioricet at her request  - Cytology - PAP( [place])  2. Encounter for surveillance of vaginal ring hormonal contraceptive device Happy with ring, refill sent  Meds ordered this encounter  Medications   etonogestrel-ethinyl estradiol (NUVARING) 0.12-0.015 MG/24HR vaginal ring    Sig: Place 1 each vaginally every 21 ( twenty-one) days. Insert vaginally and leave in place for 3 consecutive weeks, then replace with new  ring. ( continuous regimen)    Dispense:  3 each    Refill:  4    Please consider 90 day supplies to promote better adherence    Supervising Provider:   Lazaro Arms [[id number]]   butalbital-acetaminophen-caffeine (FIORICET) 50-325-40 MG tablet    Sig: Take 1 tablet by mouth every 6 (six) hours as needed for headache.    Dispense:  20 tablet    Refill:  0    Supervising Provider:   Despina Hidden, LUTHER H [[id number]]    3. ASCUS of cervix with negative high risk HPV Pap sent  4. Hot flashes  5. Night sweats  6. Hair thinning Keep watch on it, if increases, will refer to dermatology  7. Elevated BP without diagnosis of hypertension Had flat tire on way here this morning

## 2024-01-09 LAB — CYTOLOGY - PAP
Comment: NEGATIVE
Diagnosis: UNDETERMINED — AB
High risk HPV: NEGATIVE

## 2024-03-19 ENCOUNTER — Other Ambulatory Visit: Payer: Self-pay

## 2024-03-19 ENCOUNTER — Other Ambulatory Visit (HOSPITAL_BASED_OUTPATIENT_CLINIC_OR_DEPARTMENT_OTHER): Payer: Self-pay

## 2024-03-19 ENCOUNTER — Other Ambulatory Visit: Payer: Self-pay | Admitting: Adult Health

## 2024-03-19 MED ORDER — BUTALBITAL-APAP-CAFFEINE 50-325-40 MG PO TABS
1.0000 | ORAL_TABLET | Freq: Four times a day (QID) | ORAL | 0 refills | Status: DC | PRN
  Filled 2024-03-19: qty 20, 5d supply, fill #0

## 2024-04-01 ENCOUNTER — Other Ambulatory Visit (HOSPITAL_BASED_OUTPATIENT_CLINIC_OR_DEPARTMENT_OTHER): Payer: Self-pay

## 2024-04-01 ENCOUNTER — Telehealth: Admitting: Family Medicine

## 2024-04-01 DIAGNOSIS — J019 Acute sinusitis, unspecified: Secondary | ICD-10-CM

## 2024-04-01 DIAGNOSIS — B9689 Other specified bacterial agents as the cause of diseases classified elsewhere: Secondary | ICD-10-CM

## 2024-04-01 MED ORDER — AMOXICILLIN-POT CLAVULANATE 875-125 MG PO TABS
1.0000 | ORAL_TABLET | Freq: Two times a day (BID) | ORAL | 0 refills | Status: AC
  Filled 2024-04-01: qty 14, 7d supply, fill #0

## 2024-04-01 NOTE — Progress Notes (Signed)

## 2024-04-09 ENCOUNTER — Other Ambulatory Visit (HOSPITAL_BASED_OUTPATIENT_CLINIC_OR_DEPARTMENT_OTHER): Payer: Self-pay

## 2024-04-09 MED ORDER — CLONAZEPAM 0.5 MG PO TABS
0.5000 mg | ORAL_TABLET | Freq: Every day | ORAL | 5 refills | Status: AC
  Filled 2024-04-09: qty 30, 30d supply, fill #0
  Filled 2024-06-16: qty 30, 30d supply, fill #1
  Filled 2024-07-30: qty 30, 30d supply, fill #2

## 2024-04-09 MED ORDER — HYDROXYZINE HCL 25 MG PO TABS
25.0000 mg | ORAL_TABLET | Freq: Every evening | ORAL | 12 refills | Status: AC | PRN
Start: 2024-04-09 — End: ?
  Filled 2024-04-09 – 2024-06-16 (×2): qty 60, 30d supply, fill #0
  Filled 2024-07-30: qty 60, 30d supply, fill #1

## 2024-05-12 ENCOUNTER — Other Ambulatory Visit (HOSPITAL_BASED_OUTPATIENT_CLINIC_OR_DEPARTMENT_OTHER): Payer: Self-pay

## 2024-06-15 ENCOUNTER — Other Ambulatory Visit (HOSPITAL_BASED_OUTPATIENT_CLINIC_OR_DEPARTMENT_OTHER): Payer: Self-pay

## 2024-06-16 ENCOUNTER — Other Ambulatory Visit (HOSPITAL_BASED_OUTPATIENT_CLINIC_OR_DEPARTMENT_OTHER): Payer: Self-pay

## 2024-06-16 ENCOUNTER — Other Ambulatory Visit: Payer: Self-pay

## 2024-06-25 ENCOUNTER — Ambulatory Visit (HOSPITAL_BASED_OUTPATIENT_CLINIC_OR_DEPARTMENT_OTHER): Admitting: Physician Assistant

## 2024-06-25 ENCOUNTER — Ambulatory Visit (HOSPITAL_BASED_OUTPATIENT_CLINIC_OR_DEPARTMENT_OTHER)

## 2024-06-25 ENCOUNTER — Other Ambulatory Visit (HOSPITAL_BASED_OUTPATIENT_CLINIC_OR_DEPARTMENT_OTHER): Payer: Self-pay | Admitting: Family Medicine

## 2024-06-25 DIAGNOSIS — M25572 Pain in left ankle and joints of left foot: Secondary | ICD-10-CM

## 2024-07-27 ENCOUNTER — Other Ambulatory Visit (HOSPITAL_BASED_OUTPATIENT_CLINIC_OR_DEPARTMENT_OTHER): Payer: Self-pay

## 2024-07-27 ENCOUNTER — Other Ambulatory Visit: Payer: Self-pay | Admitting: Adult Health

## 2024-07-27 MED ORDER — BUTALBITAL-APAP-CAFFEINE 50-325-40 MG PO TABS
1.0000 | ORAL_TABLET | Freq: Four times a day (QID) | ORAL | 0 refills | Status: AC | PRN
  Filled 2024-07-30: qty 20, 5d supply, fill #0

## 2024-07-30 ENCOUNTER — Other Ambulatory Visit (HOSPITAL_BASED_OUTPATIENT_CLINIC_OR_DEPARTMENT_OTHER): Payer: Self-pay

## 2024-08-03 ENCOUNTER — Telehealth: Admitting: Family Medicine

## 2024-08-03 ENCOUNTER — Other Ambulatory Visit (HOSPITAL_BASED_OUTPATIENT_CLINIC_OR_DEPARTMENT_OTHER): Payer: Self-pay

## 2024-08-03 DIAGNOSIS — J019 Acute sinusitis, unspecified: Secondary | ICD-10-CM

## 2024-08-03 DIAGNOSIS — B9689 Other specified bacterial agents as the cause of diseases classified elsewhere: Secondary | ICD-10-CM

## 2024-08-03 MED ORDER — AMOXICILLIN-POT CLAVULANATE 875-125 MG PO TABS
1.0000 | ORAL_TABLET | Freq: Two times a day (BID) | ORAL | 0 refills | Status: AC
  Filled 2024-08-03: qty 14, 7d supply, fill #0

## 2024-08-03 NOTE — Progress Notes (Signed)

## 2024-08-24 ENCOUNTER — Other Ambulatory Visit (HOSPITAL_BASED_OUTPATIENT_CLINIC_OR_DEPARTMENT_OTHER): Payer: Self-pay

## 2024-08-24 MED ORDER — FLUZONE 0.5 ML IM SUSY
0.5000 mL | PREFILLED_SYRINGE | Freq: Once | INTRAMUSCULAR | 0 refills | Status: AC
  Filled 2024-08-24: qty 0.5, 1d supply, fill #0

## 2024-08-26 ENCOUNTER — Other Ambulatory Visit (HOSPITAL_BASED_OUTPATIENT_CLINIC_OR_DEPARTMENT_OTHER): Payer: Self-pay

## 2024-08-26 MED ORDER — CLONAZEPAM 0.5 MG PO TABS
0.5000 mg | ORAL_TABLET | Freq: Two times a day (BID) | ORAL | 5 refills | Status: AC | PRN
  Filled 2024-08-26: qty 60, 30d supply, fill #0

## 2024-08-26 MED ORDER — PANTOPRAZOLE SODIUM 40 MG PO TBEC
40.0000 mg | DELAYED_RELEASE_TABLET | Freq: Every day | ORAL | 5 refills | Status: AC
  Filled 2024-08-26: qty 30, 30d supply, fill #0

## 2024-08-26 MED ORDER — HYDROXYZINE HCL 25 MG PO TABS
25.0000 mg | ORAL_TABLET | Freq: Every evening | ORAL | 12 refills | Status: AC | PRN
  Filled 2024-08-26: qty 60, 30d supply, fill #0

## 2024-09-21 ENCOUNTER — Encounter: Payer: Self-pay | Admitting: Radiology

## 2024-10-13 ENCOUNTER — Encounter: Payer: Self-pay | Admitting: Gastroenterology

## 2024-10-14 ENCOUNTER — Other Ambulatory Visit (HOSPITAL_BASED_OUTPATIENT_CLINIC_OR_DEPARTMENT_OTHER): Payer: Self-pay

## 2024-12-01 ENCOUNTER — Ambulatory Visit: Admitting: Gastroenterology

## 2025-01-13 ENCOUNTER — Ambulatory Visit: Admitting: Gastroenterology

## 2025-02-24 ENCOUNTER — Ambulatory Visit: Payer: Self-pay | Admitting: Adult Health
